# Patient Record
Sex: Female | Born: 1935 | Race: White | Hispanic: No | Marital: Married | Smoking: Never smoker
Health system: Southern US, Community
[De-identification: ages and names within clinical notes are randomized; demographics above are authoritative.]

## PROBLEM LIST (undated history)

## (undated) DIAGNOSIS — G20A1 Parkinson's disease without dyskinesia, without mention of fluctuations: Secondary | ICD-10-CM

## (undated) DIAGNOSIS — G20 Parkinson's disease: Secondary | ICD-10-CM

---

## 2004-06-29 ENCOUNTER — Ambulatory Visit: Payer: Self-pay | Admitting: Unknown Physician Specialty

## 2004-10-18 ENCOUNTER — Ambulatory Visit: Payer: Self-pay | Admitting: Internal Medicine

## 2005-07-01 ENCOUNTER — Ambulatory Visit: Payer: Self-pay | Admitting: Internal Medicine

## 2005-10-05 ENCOUNTER — Ambulatory Visit: Payer: Self-pay | Admitting: Internal Medicine

## 2005-10-19 ENCOUNTER — Ambulatory Visit: Payer: Self-pay | Admitting: Internal Medicine

## 2006-11-16 ENCOUNTER — Ambulatory Visit: Payer: Self-pay | Admitting: Internal Medicine

## 2007-11-19 ENCOUNTER — Ambulatory Visit: Payer: Self-pay | Admitting: Internal Medicine

## 2008-12-08 ENCOUNTER — Ambulatory Visit: Payer: Self-pay | Admitting: Unknown Physician Specialty

## 2009-02-20 ENCOUNTER — Ambulatory Visit: Payer: Self-pay | Admitting: Unknown Physician Specialty

## 2009-03-02 ENCOUNTER — Ambulatory Visit: Payer: Self-pay | Admitting: Internal Medicine

## 2009-05-25 ENCOUNTER — Inpatient Hospital Stay: Payer: Self-pay | Admitting: Vascular Surgery

## 2010-02-09 ENCOUNTER — Ambulatory Visit: Payer: Self-pay | Admitting: Internal Medicine

## 2010-02-16 ENCOUNTER — Ambulatory Visit: Payer: Self-pay | Admitting: Unknown Physician Specialty

## 2010-02-19 ENCOUNTER — Emergency Department: Payer: Self-pay | Admitting: Emergency Medicine

## 2010-02-19 ENCOUNTER — Ambulatory Visit: Payer: Self-pay | Admitting: Internal Medicine

## 2010-03-04 ENCOUNTER — Ambulatory Visit: Payer: Self-pay | Admitting: Internal Medicine

## 2011-04-21 ENCOUNTER — Ambulatory Visit: Payer: Self-pay | Admitting: Internal Medicine

## 2012-02-08 ENCOUNTER — Ambulatory Visit: Payer: Self-pay | Admitting: Emergency Medicine

## 2012-03-12 ENCOUNTER — Ambulatory Visit: Payer: Self-pay | Admitting: Emergency Medicine

## 2012-04-24 ENCOUNTER — Ambulatory Visit: Payer: Self-pay | Admitting: Family Medicine

## 2012-10-24 ENCOUNTER — Ambulatory Visit: Payer: Self-pay | Admitting: Family Medicine

## 2013-06-06 ENCOUNTER — Ambulatory Visit: Payer: Self-pay | Admitting: Family Medicine

## 2016-07-19 ENCOUNTER — Encounter: Payer: Self-pay | Admitting: Emergency Medicine

## 2016-07-19 ENCOUNTER — Inpatient Hospital Stay
Admission: EM | Admit: 2016-07-19 | Discharge: 2016-07-23 | DRG: 481 | Disposition: A | Discharge status: Skilled Nursing Facility | Payer: Medicare Other | Attending: Internal Medicine | Admitting: Internal Medicine

## 2016-07-19 ENCOUNTER — Emergency Department: Payer: Medicare Other

## 2016-07-19 DIAGNOSIS — S72009A Fracture of unspecified part of neck of unspecified femur, initial encounter for closed fracture: Secondary | ICD-10-CM | POA: Diagnosis present

## 2016-07-19 DIAGNOSIS — F0390 Unspecified dementia without behavioral disturbance: Secondary | ICD-10-CM | POA: Diagnosis present

## 2016-07-19 DIAGNOSIS — W1830XA Fall on same level, unspecified, initial encounter: Secondary | ICD-10-CM | POA: Diagnosis present

## 2016-07-19 DIAGNOSIS — R509 Fever, unspecified: Secondary | ICD-10-CM

## 2016-07-19 DIAGNOSIS — Y92009 Unspecified place in unspecified non-institutional (private) residence as the place of occurrence of the external cause: Secondary | ICD-10-CM

## 2016-07-19 DIAGNOSIS — E611 Iron deficiency: Secondary | ICD-10-CM | POA: Diagnosis present

## 2016-07-19 DIAGNOSIS — R262 Difficulty in walking, not elsewhere classified: Secondary | ICD-10-CM

## 2016-07-19 DIAGNOSIS — R5383 Other fatigue: Secondary | ICD-10-CM | POA: Diagnosis present

## 2016-07-19 DIAGNOSIS — M25551 Pain in right hip: Secondary | ICD-10-CM

## 2016-07-19 DIAGNOSIS — Z79899 Other long term (current) drug therapy: Secondary | ICD-10-CM

## 2016-07-19 DIAGNOSIS — R630 Anorexia: Secondary | ICD-10-CM | POA: Diagnosis present

## 2016-07-19 DIAGNOSIS — M6282 Rhabdomyolysis: Secondary | ICD-10-CM | POA: Diagnosis present

## 2016-07-19 DIAGNOSIS — Z419 Encounter for procedure for purposes other than remedying health state, unspecified: Secondary | ICD-10-CM

## 2016-07-19 DIAGNOSIS — S72141A Displaced intertrochanteric fracture of right femur, initial encounter for closed fracture: Secondary | ICD-10-CM | POA: Diagnosis present

## 2016-07-19 DIAGNOSIS — G20 Parkinson's disease: Secondary | ICD-10-CM | POA: Diagnosis present

## 2016-07-19 DIAGNOSIS — M6281 Muscle weakness (generalized): Secondary | ICD-10-CM

## 2016-07-19 DIAGNOSIS — R0602 Shortness of breath: Secondary | ICD-10-CM

## 2016-07-19 HISTORY — DX: Parkinson's disease without dyskinesia, without mention of fluctuations: G20.A1

## 2016-07-19 HISTORY — DX: Parkinson's disease: G20

## 2016-07-19 LAB — COMPREHENSIVE METABOLIC PANEL
ALK PHOS: 51 U/L (ref 38–126)
ALK PHOS: 43 U/L (ref 38–126)
ALT: 16 U/L (ref 14–54)
ALT: 20 U/L (ref 14–54)
ANION GAP: 10 (ref 5–15)
ANION GAP: 9 (ref 5–15)
AST: 73 U/L — ABNORMAL HIGH (ref 15–41)
AST: 71 U/L — ABNORMAL HIGH (ref 15–41)
Albumin: 3.7 g/dL (ref 3.5–5.0)
Albumin: 3.3 g/dL — ABNORMAL LOW (ref 3.5–5.0)
BILIRUBIN TOTAL: 1.3 mg/dL — AB (ref 0.3–1.2)
BILIRUBIN TOTAL: 1.1 mg/dL (ref 0.3–1.2)
BUN: 30 mg/dL — ABNORMAL HIGH (ref 6–20)
BUN: 30 mg/dL — ABNORMAL HIGH (ref 6–20)
CALCIUM: 8.1 mg/dL — AB (ref 8.9–10.3)
CO2: 30 mmol/L (ref 22–32)
CO2: 28 mmol/L (ref 22–32)
CREATININE: 1.01 mg/dL — AB (ref 0.44–1.00)
Calcium: 8.7 mg/dL — ABNORMAL LOW (ref 8.9–10.3)
Chloride: 100 mmol/L — ABNORMAL LOW (ref 101–111)
Chloride: 103 mmol/L (ref 101–111)
Creatinine, Ser: 1.09 mg/dL — ABNORMAL HIGH (ref 0.44–1.00)
GFR calc non Af Amer: 45 mL/min — ABNORMAL LOW (ref >60)
GFR calc non Af Amer: 50 mL/min — ABNORMAL LOW (ref >60)
GFR, EST AFRICAN AMERICAN: 52 mL/min — AB (ref >60)
GFR, EST AFRICAN AMERICAN: 58 mL/min — AB (ref >60)
GLUCOSE: 168 mg/dL — AB (ref 65–99)
GLUCOSE: 133 mg/dL — AB (ref 65–99)
Potassium: 3.8 mmol/L (ref 3.5–5.1)
Potassium: 4.1 mmol/L (ref 3.5–5.1)
Sodium: 140 mmol/L (ref 135–145)
Sodium: 140 mmol/L (ref 135–145)
TOTAL PROTEIN: 7.3 g/dL (ref 6.5–8.1)
TOTAL PROTEIN: 6.5 g/dL (ref 6.5–8.1)

## 2016-07-19 LAB — PROTIME-INR
INR: 1.02
INR: 1.06
Prothrombin Time: 13.4 seconds (ref 11.4–15.2)
Prothrombin Time: 13.8 seconds (ref 11.4–15.2)

## 2016-07-19 LAB — CBC WITH DIFFERENTIAL/PLATELET
Basophils Absolute: 0 10*3/uL (ref 0–0.1)
Basophils Absolute: 0 10*3/uL (ref 0–0.1)
Basophils Relative: 0 %
Basophils Relative: 0 %
Eosinophils Absolute: 0 10*3/uL (ref 0–0.7)
Eosinophils Absolute: 0 10*3/uL (ref 0–0.7)
Eosinophils Relative: 0 %
Eosinophils Relative: 0 %
HEMATOCRIT: 39.6 % (ref 35.0–47.0)
HEMATOCRIT: 37.7 % (ref 35.0–47.0)
HEMOGLOBIN: 13.6 g/dL (ref 12.0–16.0)
Hemoglobin: 13.1 g/dL (ref 12.0–16.0)
LYMPHS ABS: 0.5 10*3/uL — AB (ref 1.0–3.6)
LYMPHS ABS: 0.8 10*3/uL — AB (ref 1.0–3.6)
LYMPHS PCT: 6 %
Lymphocytes Relative: 3 %
MCH: 31.7 pg (ref 26.0–34.0)
MCH: 32 pg (ref 26.0–34.0)
MCHC: 34.3 g/dL (ref 32.0–36.0)
MCHC: 34.8 g/dL (ref 32.0–36.0)
MCV: 92.4 fL (ref 80.0–100.0)
MCV: 92.1 fL (ref 80.0–100.0)
MONOS PCT: 7 %
MONOS PCT: 9 %
Monocytes Absolute: 1 10*3/uL — ABNORMAL HIGH (ref 0.2–0.9)
Monocytes Absolute: 1.3 10*3/uL — ABNORMAL HIGH (ref 0.2–0.9)
NEUTROS ABS: 12.7 10*3/uL — AB (ref 1.4–6.5)
NEUTROS ABS: 12.1 10*3/uL — AB (ref 1.4–6.5)
NEUTROS PCT: 90 %
NEUTROS PCT: 85 %
Platelets: 166 10*3/uL (ref 150–440)
Platelets: 157 10*3/uL (ref 150–440)
RBC: 4.29 MIL/uL (ref 3.80–5.20)
RBC: 4.09 MIL/uL (ref 3.80–5.20)
RDW: 14.2 % (ref 11.5–14.5)
RDW: 14.1 % (ref 11.5–14.5)
WBC: 14.2 10*3/uL — ABNORMAL HIGH (ref 3.6–11.0)
WBC: 14.1 10*3/uL — AB (ref 3.6–11.0)

## 2016-07-19 LAB — URINALYSIS, ROUTINE W REFLEX MICROSCOPIC
Bacteria, UA: NONE SEEN
Bilirubin Urine: NEGATIVE
GLUCOSE, UA: 50 mg/dL — AB
Hgb urine dipstick: NEGATIVE
KETONES UR: 5 mg/dL — AB
Nitrite: NEGATIVE
PH: 5 (ref 5.0–8.0)
Protein, ur: 30 mg/dL — AB
SPECIFIC GRAVITY, URINE: 1.024 (ref 1.005–1.030)

## 2016-07-19 LAB — TYPE AND SCREEN
ABO/RH(D): O POS
ANTIBODY SCREEN: NEGATIVE

## 2016-07-19 LAB — APTT: aPTT: 27 seconds (ref 24–36)

## 2016-07-19 LAB — CK: Total CK: 2307 U/L — ABNORMAL HIGH (ref 38–234)

## 2016-07-19 LAB — TROPONIN I: TROPONIN I: 0.03 ng/mL — AB (ref <0.03)

## 2016-07-19 MED ORDER — SODIUM CHLORIDE 0.9 % IV BOLUS (SEPSIS)
1000.0000 mL | Freq: Once | INTRAVENOUS | Status: AC
  Administered 2016-07-19: 1000 mL via INTRAVENOUS

## 2016-07-19 MED ORDER — SODIUM CHLORIDE 0.9 % IV SOLN
INTRAVENOUS | Status: DC
  Administered 2016-07-19: 19:00:00 via INTRAVENOUS

## 2016-07-19 MED ORDER — CALCIUM CARBONATE-VITAMIN D 600-400 MG-UNIT PO TABS
1.0000 | ORAL_TABLET | Freq: Every day | ORAL | Status: DC
  Filled 2016-07-19: qty 1

## 2016-07-19 MED ORDER — MORPHINE SULFATE (PF) 4 MG/ML IV SOLN
1.0000 mg | INTRAVENOUS | Status: DC | PRN
  Administered 2016-07-19: 1 mg via INTRAVENOUS
  Filled 2016-07-19: qty 1

## 2016-07-19 MED ORDER — MORPHINE SULFATE (PF) 4 MG/ML IV SOLN
4.0000 mg | Freq: Once | INTRAVENOUS | Status: AC
  Administered 2016-07-19: 4 mg via INTRAVENOUS
  Filled 2016-07-19: qty 1

## 2016-07-19 MED ORDER — ALOE VERA 500 MG PO CAPS: 1.0000 | ORAL_CAPSULE | Freq: Every evening | ORAL | Status: DC

## 2016-07-19 MED ORDER — ZOLPIDEM TARTRATE 5 MG PO TABS: 5.0000 mg | ORAL_TABLET | Freq: Every evening | ORAL | Status: DC | PRN

## 2016-07-19 MED ORDER — CLINDAMYCIN PHOSPHATE 600 MG/50ML IV SOLN
600.0000 mg | INTRAVENOUS | Status: AC
  Administered 2016-07-20: 600 mg via INTRAVENOUS
  Filled 2016-07-19: qty 50

## 2016-07-19 MED ORDER — CITALOPRAM HYDROBROMIDE 20 MG PO TABS
20.0000 mg | ORAL_TABLET | Freq: Every day | ORAL | Status: DC
  Administered 2016-07-19: 20 mg via ORAL
  Filled 2016-07-19: qty 1

## 2016-07-19 MED ORDER — RALOXIFENE HCL 60 MG PO TABS
60.0000 mg | ORAL_TABLET | Freq: Every day | ORAL | Status: DC
  Administered 2016-07-19: 60 mg via ORAL
  Filled 2016-07-19: qty 1

## 2016-07-19 MED ORDER — CARBIDOPA-LEVODOPA ER 50-200 MG PO TBCR
1.0000 | EXTENDED_RELEASE_TABLET | Freq: Two times a day (BID) | ORAL | Status: DC
  Administered 2016-07-19: 1 via ORAL
  Filled 2016-07-19 (×2): qty 1

## 2016-07-19 MED ORDER — METHOCARBAMOL 1000 MG/10ML IJ SOLN
500.0000 mg | Freq: Four times a day (QID) | INTRAVENOUS | Status: DC | PRN
  Filled 2016-07-19: qty 5

## 2016-07-19 MED ORDER — METHOCARBAMOL 500 MG PO TABS: 500.0000 mg | ORAL_TABLET | Freq: Four times a day (QID) | ORAL | Status: DC | PRN

## 2016-07-19 MED ORDER — CEFAZOLIN SODIUM-DEXTROSE 2-4 GM/100ML-% IV SOLN
2.0000 g | INTRAVENOUS | Status: AC
  Administered 2016-07-20: 2 g via INTRAVENOUS
  Filled 2016-07-19: qty 100

## 2016-07-19 MED ORDER — SENNA 8.6 MG PO TABS
1.0000 | ORAL_TABLET | Freq: Two times a day (BID) | ORAL | Status: DC
  Administered 2016-07-19 (×2): 8.6 mg via ORAL
  Filled 2016-07-19 (×2): qty 1

## 2016-07-19 MED ORDER — HYDROCODONE-ACETAMINOPHEN 5-325 MG PO TABS
1.0000 | ORAL_TABLET | Freq: Four times a day (QID) | ORAL | Status: DC | PRN
  Administered 2016-07-19 (×2): 1 via ORAL
  Filled 2016-07-19 (×2): qty 1

## 2016-07-19 NOTE — ED Triage Notes (Signed)
Pt via ems from home; last seen last night at 1730; found on floor today. Pt has shortening and external rotation of right lower extremity. Pt A&O x 4. NAD noted.

## 2016-07-19 NOTE — ED Provider Notes (Signed)
[facility]-EMERGENCY DEPARTMENT Provider Note   CSN: [id number] Arrival date & time: 07/19/16  1058     History   Chief Complaint Chief Complaint  Patient presents with  . Leg Injury  . Fall    HPI Wendy Meyer is a 82 y.o. female hx of parkinson's, thyroid nodule, Here presenting with fall. Patient states that she slip and fall and leg on the right hip. Patient was unable to get up afterwards. Patient states that she did hit her head as well. Patient was found by family laying on the floor this morning. Last time somebody check on her was yesterday around 5:30 PM. Patient not on blood thinners. Was noted to have obvious deformity R hip as per EMS.   The history is provided by the patient.    History reviewed. No pertinent past medical history.  There are no active problems to display for this patient.   History reviewed. No pertinent surgical history.  OB History    No data available       Home Medications    Prior to Admission medications   Not on File    Family History History reviewed. No pertinent family history.  Social History Social History  Substance Use Topics  . Smoking status: Unknown If Ever Smoked  . Smokeless tobacco: Never Used  . Alcohol use No     Allergies   Patient has no allergy information on record.   Review of Systems Review of Systems  Musculoskeletal:       R hip pain   All other systems reviewed and are negative.    Physical Exam Updated Vital Signs BP (!) 104/53 (BP Location: Right Arm)   Pulse 100   Temp 98.5 F (36.9 C) (Oral)   Resp 20   Ht 5\' 7"  (1.702 m)   Wt 148 lb 13 oz (67.5 kg)   SpO2 97%   BMI 23.31 kg/m   Physical Exam  Constitutional:  Uncomfortable, chronically ill   HENT:  Head: Normocephalic.  No obvious scalp hematoma   Eyes: EOM are normal. Pupils are equal, round, and reactive to light.  Neck: Normal range of motion. Neck supple.  No obvious deformity. C collar applied     Cardiovascular: Normal rate, regular rhythm and normal heart sounds.   Pulmonary/Chest: Effort normal and breath sounds normal. No respiratory distress. She has no wheezes.  Abdominal: Soft. Bowel sounds are normal. She exhibits no distension. There is no tenderness. There is no guarding.  Musculoskeletal:  Obvious deformity R hip, able to wiggle toes, 2+ dorsalis pedis pulse. No obvious spinal tenderness   Neurological: She is alert.  Skin: Skin is warm.  Psychiatric: She has a normal mood and affect.  Nursing note and vitals reviewed.    ED Treatments / Results  Labs (all labs ordered are listed, but only abnormal results are displayed) Labs Reviewed  CBC WITH DIFFERENTIAL/PLATELET - Abnormal; Notable for the following:       Result Value   WBC 14.2 (*)    Neutro Abs 12.7 (*)    Lymphs Abs 0.5 (*)    Monocytes Absolute 1.0 (*)    All other components within normal limits  COMPREHENSIVE METABOLIC PANEL - Abnormal; Notable for the following:    Chloride 100 (*)    Glucose, Bld 168 (*)    BUN 30 (*)    Creatinine, Ser 1.09 (*)    Calcium 8.7 (*)    AST 73 (*)    Total  Bilirubin 1.3 (*)    GFR calc non Af Amer 45 (*)    GFR calc Af Amer 52 (*)    All other components within normal limits  TROPONIN I - Abnormal; Notable for the following:    Troponin I 0.03 (*)    All other components within normal limits  CK - Abnormal; Notable for the following:    Total CK 2,307 (*)    All other components within normal limits  PROTIME-INR    EKG  EKG Interpretation None      ED ECG REPORT I, Richardean Canal, the attending physician, personally viewed and interpreted this ECG.   Date: 07/19/2016  EKG Time: 11:20 am  Rate: 97  Rhythm: normal EKG, normal sinus rhythm  Axis: normal  Intervals:none  ST&T Change: nonspecific    Radiology Dg Chest 1 View  Result Date: 07/19/2016 CLINICAL DATA:  Fall. EXAM: CHEST 1 VIEW COMPARISON:  05/25/2009 . FINDINGS: Mediastinum and hilar  structures are normal. Basilar pulmonary interstitial prominence noted consistent with chronic interstitial lung disease. No change from prior exam . Heart size stable. No pulmonary venous congestion . IMPRESSION: Chronic interstitial lung disease. Electronically Signed   By: Maisie Fus  Register   On: 07/19/2016 12:07   Ct Head Wo Contrast  Result Date: 07/19/2016 CLINICAL DATA:  Confusion, weakness, found on the floor, neck pain EXAM: CT HEAD WITHOUT CONTRAST CT CERVICAL SPINE WITHOUT CONTRAST TECHNIQUE: Multidetector CT imaging of the head and cervical spine was performed following the standard protocol without intravenous contrast. Multiplanar CT image reconstructions of the cervical spine were also generated. COMPARISON:  10/30/ 10 FINDINGS: CT HEAD FINDINGS Brain: No intracranial hemorrhage, mass effect or midline shift. Moderate cerebral atrophy. Extensive periventricular and patchy subcortical white matter decreased attenuation probable due to small vessel ischemic changes. No acute cortical infarction. No mass lesion is noted on this unenhanced scan. Vascular: Atherosclerotic calcifications of carotid siphon. Atherosclerotic calcifications of vertebral arteries. Skull: No skull fracture is noted. Metallic dental artifact are noted. Sinuses/Orbits: No acute findings. There is previous right mastoidectomy Other: None CT CERVICAL SPINE FINDINGS Alignment: Normal alignment of the cervical spine Skull base and vertebrae: No acute fracture or subluxation. Degenerative changes are noted C1-C2 articulation. Mild anterior spurring at C5-C6 level. Soft tissues and spinal canal: Spinal canal is patent. No spinal canal hematoma. No prevertebral soft tissue swelling. Disc levels:  Minimal disc space flattening at C5-C6 level. Upper chest: There is no pneumothorax in visualized lung apices. Other: Atherosclerotic calcifications are noted bilateral carotid bifurcation. IMPRESSION: 1. No acute intracranial abnormality.  Moderate cerebral atrophy. No definite acute cortical infarction. Significant periventricular and patchy subcortical white matter decreased attenuation probable due to chronic small vessel ischemic changes. 2. No cervical spine acute fracture or subluxation. Degenerative changes as described above. Electronically Signed   By: Natasha Mead M.D.   On: 07/19/2016 12:02   Ct Cervical Spine Wo Contrast  Result Date: 07/19/2016 CLINICAL DATA:  Confusion, weakness, found on the floor, neck pain EXAM: CT HEAD WITHOUT CONTRAST CT CERVICAL SPINE WITHOUT CONTRAST TECHNIQUE: Multidetector CT imaging of the head and cervical spine was performed following the standard protocol without intravenous contrast. Multiplanar CT image reconstructions of the cervical spine were also generated. COMPARISON:  10/30/ 10 FINDINGS: CT HEAD FINDINGS Brain: No intracranial hemorrhage, mass effect or midline shift. Moderate cerebral atrophy. Extensive periventricular and patchy subcortical white matter decreased attenuation probable due to small vessel ischemic changes. No acute cortical infarction. No mass lesion is  noted on this unenhanced scan. Vascular: Atherosclerotic calcifications of carotid siphon. Atherosclerotic calcifications of vertebral arteries. Skull: No skull fracture is noted. Metallic dental artifact are noted. Sinuses/Orbits: No acute findings. There is previous right mastoidectomy Other: None CT CERVICAL SPINE FINDINGS Alignment: Normal alignment of the cervical spine Skull base and vertebrae: No acute fracture or subluxation. Degenerative changes are noted C1-C2 articulation. Mild anterior spurring at C5-C6 level. Soft tissues and spinal canal: Spinal canal is patent. No spinal canal hematoma. No prevertebral soft tissue swelling. Disc levels:  Minimal disc space flattening at C5-C6 level. Upper chest: There is no pneumothorax in visualized lung apices. Other: Atherosclerotic calcifications are noted bilateral carotid  bifurcation. IMPRESSION: 1. No acute intracranial abnormality. Moderate cerebral atrophy. No definite acute cortical infarction. Significant periventricular and patchy subcortical white matter decreased attenuation probable due to chronic small vessel ischemic changes. 2. No cervical spine acute fracture or subluxation. Degenerative changes as described above. Electronically Signed   By: Natasha MeadLiviu  Pop M.D.   On: 07/19/2016 12:02   Dg Hip Unilat W Or Wo Pelvis 2-3 Views Right  Result Date: 07/19/2016 CLINICAL DATA:  Injury. EXAM: DG HIP (WITH OR WITHOUT PELVIS) 2-3V RIGHT COMPARISON:  No recent prior. FINDINGS: Angulated comminuted right intertrochanteric hip fracture noted. Diffuse osteopenia degenerative change. Aortoiliac atherosclerotic vascular calcification. Prominent amount of stool noted throughout the colon. IMPRESSION: Angulated comminuted intertrochanteric hip fracture on the right noted. Electronically Signed   By: Maisie Fushomas  Register   On: 07/19/2016 12:09    Procedures Procedures (including critical care time)  Medications Ordered in ED Medications  morphine 4 MG/ML injection 4 mg (4 mg Intravenous Given 07/19/16 1212)     Initial Impression / Assessment and Plan / ED Course  I have reviewed the triage vital signs and the nursing notes.  Pertinent labs & imaging results that were available during my care of the patient were reviewed by me and considered in my medical decision making (see chart for details).  Clinical Course     Wendy Meyer is a 82 y.o. female here with R hip pain s/p fall. Had mechanical fall with head injury with obvious R hip deformity. Will get preop labs, EKG, CT head/neck, xrays.  12:35 PM Xray showed R inter trochanteric fracture. CK 2000, no acute renal failure. CT head/neck unremarkable. Dr. Hyacinth MeekerMiller to perform surgery tomorrow. Hospitalist to admit for preop clearance, mild rhabdo.     Final Clinical Impressions(s) / ED Diagnoses   Final  diagnoses:  None    New Prescriptions New Prescriptions   No medications on file     Charlynne Panderavid Hsienta Yao, MD 07/19/16 1236

## 2016-07-19 NOTE — Progress Notes (Signed)
Family Meeting Note  Advance Directive:yes  Today a meeting took place with the grandson and his wife in the emergency room Patient is is unable to participate due ZO:[id number]:Lacked capacity to make decision   The following clinical team members were present during this meeting: Patient's grandson and his wife The following were discussed:Patient's diagnosis: Discussed with family regarding patient's acute hip fracture and questions answered  CODE STATUS addressed. Patient is a full code. Healthcare power of attorney is patient's grandson Wendy Meyer  Time spent during discussion20 mins Enedina FinnerPATEL,[name], MD

## 2016-07-19 NOTE — ED Notes (Signed)
Judeth CornfieldStephanie, granddaughter, [phone]

## 2016-07-19 NOTE — ED Notes (Signed)
Pt's POA and living will scanned by registration Byrd Hesselbach(Maria) and originals returned to grandson at bedside.

## 2016-07-19 NOTE — H&P (Signed)
[facility] [place] at [facility]   PATIENT NAME: Wendy Meyer    MR#:  [id number]  DATE OF BIRTH:  February 01, 1930  DATE OF ADMISSION:  07/19/2016  PRIMARY CARE PHYSICIAN: Rolm Gala, MD   REQUESTING/REFERRING PHYSICIAN:  Dr Darnelle Catalan  Mechanical fall at home. HISTORY OF PRESENT ILLNESS:  Wendy Meyer  is a 82 y.o. female with a known history of Parkinson's disease comes to the emergency room after she had a mechanical fall at home. History is obtained from patient's grandson and his wife are her healthcare power of attorney. Patient was seen by his grandson last night at 5:30 PM. This morning when his wife went to go check on the patient up putting the routine she was found laying on the floor. Her night light was on and it seems patient was down since middle of the night. Duration unknown. Her evaluation in the emergency room found she is right intertrochanteric fracture along with elevated CPK suggesting acute rhabdomyolysis. She is being admitted for further evaluation and management. Per family she has been having issues with memory and mixes her heart disease at night. She also has underlying mild dementia. Patient is being admitted for further eval showed management. Per family she does not have any cardiac history.  PAST MEDICAL HISTORY:   Past Medical History:  Diagnosis Date  . Parkinson disease (HCC)     PAST SURGICAL HISTOIRY:  History reviewed. No pertinent surgical history.  SOCIAL HISTORY:   Social History  Substance Use Topics  . Smoking status: Unknown If Ever Smoked  . Smokeless tobacco: Never Used  . Alcohol use No    FAMILY HISTORY:  History reviewed. No pertinent family history.  DRUG ALLERGIES:   Allergies  Allergen Reactions  . Nsaids Other (See Comments)    Gastric upset.     REVIEW OF SYSTEMS:  Review of Systems  Constitutional: Negative for chills, fever and weight loss.  HENT: Negative for ear  discharge, ear pain and nosebleeds.   Eyes: Negative for blurred vision, pain and discharge.  Respiratory: Negative for sputum production, shortness of breath, wheezing and stridor.   Cardiovascular: Negative for chest pain, palpitations, orthopnea and PND.  Gastrointestinal: Negative for abdominal pain, diarrhea, nausea and vomiting.  Genitourinary: Negative for frequency and urgency.  Musculoskeletal: Positive for joint pain. Negative for back pain.  Neurological: Positive for weakness. Negative for sensory change, speech change and focal weakness.  Psychiatric/Behavioral: Negative for depression and hallucinations. The patient is not nervous/anxious.      MEDICATIONS AT HOME:   Prior to Admission medications   Medication Sig Start Date End Date Taking? Authorizing Provider  Aloe Vera 500 MG CAPS Take 1 capsule by mouth every evening.   Yes Historical Provider, MD  Calcium Carbonate-Vitamin D (CALTRATE 600+D) 600-400 MG-UNIT tablet Take 1 tablet by mouth daily.   Yes Historical Provider, MD  carbidopa-levodopa (SINEMET CR) 50-200 MG tablet Take 1 tablet by mouth 2 (two) times daily.   Yes Historical Provider, MD  citalopram (CELEXA) 20 MG tablet Take 20 mg by mouth daily.   Yes Historical Provider, MD  raloxifene (EVISTA) 60 MG tablet Take 60 mg by mouth daily.   Yes Historical Provider, MD      VITAL SIGNS:  Blood pressure (!) 104/53, pulse 100, temperature 98.5 F (36.9 C), temperature source Oral, resp. rate 20, height 5\' 7"  (1.702 m), weight 67.5 kg (148 lb 13 oz), SpO2 97 %.  PHYSICAL EXAMINATION:  GENERAL:  82  y.o.-year-old patient lying in the bed with no acute distress.  EYES: Pupils equal, round, reactive to light and accommodation. No scleral icterus. Extraocular muscles intact.  HEENT: Head atraumatic, normocephalic. Oropharynx and nasopharynx clear. Dry oral mucosa NECK:  Supple, no jugular venous distention. No thyroid enlargement, no tenderness.  LUNGS: Normal breath  sounds bilaterally, no wheezing, rales,rhonchi or crepitation. No use of accessory muscles of respiration.  CARDIOVASCULAR: S1, S2 normal. No murmurs, rubs, or gallops.  ABDOMEN: Soft, nontender, nondistended. Bowel sounds present. No organomegaly or mass.  EXTREMITIES: Right lower extremity rotated. No pedal edema, cyanosis, or clubbing.  NEUROLOGIC: Overall nonfocal unable to assess at present details secondary to patient's mild dementia. She also has fracture of the hip and unable to move her legs.  PSYCHIATRIC: The patient is alert SKIN: No obvious rash, lesion, or ulcer.   LABORATORY PANEL:   CBC  Recent Labs Lab 07/19/16 1118  WBC 14.2*  HGB 13.6  HCT 39.6  PLT 166   ------------------------------------------------------------------------------------------------------------------  Chemistries   Recent Labs Lab 07/19/16 1118  NA 140  K 3.8  CL 100*  CO2 30  GLUCOSE 168*  BUN 30*  CREATININE 1.09*  CALCIUM 8.7*  AST 73*  ALT 16  ALKPHOS 51  BILITOT 1.3*   ------------------------------------------------------------------------------------------------------------------  Cardiac Enzymes  Recent Labs Lab 07/19/16 1118  TROPONINI 0.03*   ------------------------------------------------------------------------------------------------------------------  RADIOLOGY:  Dg Chest 1 View  Result Date: 07/19/2016 CLINICAL DATA:  Fall. EXAM: CHEST 1 VIEW COMPARISON:  05/25/2009 . FINDINGS: Mediastinum and hilar structures are normal. Basilar pulmonary interstitial prominence noted consistent with chronic interstitial lung disease. No change from prior exam . Heart size stable. No pulmonary venous congestion . IMPRESSION: Chronic interstitial lung disease. Electronically Signed   By: Maisie Fus  Register   On: 07/19/2016 12:07   Ct Head Wo Contrast  Result Date: 07/19/2016 CLINICAL DATA:  Confusion, weakness, found on the floor, neck pain EXAM: CT HEAD WITHOUT CONTRAST CT  CERVICAL SPINE WITHOUT CONTRAST TECHNIQUE: Multidetector CT imaging of the head and cervical spine was performed following the standard protocol without intravenous contrast. Multiplanar CT image reconstructions of the cervical spine were also generated. COMPARISON:  10/30/ 10 FINDINGS: CT HEAD FINDINGS Brain: No intracranial hemorrhage, mass effect or midline shift. Moderate cerebral atrophy. Extensive periventricular and patchy subcortical white matter decreased attenuation probable due to small vessel ischemic changes. No acute cortical infarction. No mass lesion is noted on this unenhanced scan. Vascular: Atherosclerotic calcifications of carotid siphon. Atherosclerotic calcifications of vertebral arteries. Skull: No skull fracture is noted. Metallic dental artifact are noted. Sinuses/Orbits: No acute findings. There is previous right mastoidectomy Other: None CT CERVICAL SPINE FINDINGS Alignment: Normal alignment of the cervical spine Skull base and vertebrae: No acute fracture or subluxation. Degenerative changes are noted C1-C2 articulation. Mild anterior spurring at C5-C6 level. Soft tissues and spinal canal: Spinal canal is patent. No spinal canal hematoma. No prevertebral soft tissue swelling. Disc levels:  Minimal disc space flattening at C5-C6 level. Upper chest: There is no pneumothorax in visualized lung apices. Other: Atherosclerotic calcifications are noted bilateral carotid bifurcation. IMPRESSION: 1. No acute intracranial abnormality. Moderate cerebral atrophy. No definite acute cortical infarction. Significant periventricular and patchy subcortical white matter decreased attenuation probable due to chronic small vessel ischemic changes. 2. No cervical spine acute fracture or subluxation. Degenerative changes as described above. Electronically Signed   By: Natasha Mead M.D.   On: 07/19/2016 12:02   Ct Cervical Spine Wo Contrast  Result Date: 07/19/2016 CLINICAL  DATA:  Confusion, weakness, found  on the floor, neck pain EXAM: CT HEAD WITHOUT CONTRAST CT CERVICAL SPINE WITHOUT CONTRAST TECHNIQUE: Multidetector CT imaging of the head and cervical spine was performed following the standard protocol without intravenous contrast. Multiplanar CT image reconstructions of the cervical spine were also generated. COMPARISON:  10/30/ 10 FINDINGS: CT HEAD FINDINGS Brain: No intracranial hemorrhage, mass effect or midline shift. Moderate cerebral atrophy. Extensive periventricular and patchy subcortical white matter decreased attenuation probable due to small vessel ischemic changes. No acute cortical infarction. No mass lesion is noted on this unenhanced scan. Vascular: Atherosclerotic calcifications of carotid siphon. Atherosclerotic calcifications of vertebral arteries. Skull: No skull fracture is noted. Metallic dental artifact are noted. Sinuses/Orbits: No acute findings. There is previous right mastoidectomy Other: None CT CERVICAL SPINE FINDINGS Alignment: Normal alignment of the cervical spine Skull base and vertebrae: No acute fracture or subluxation. Degenerative changes are noted C1-C2 articulation. Mild anterior spurring at C5-C6 level. Soft tissues and spinal canal: Spinal canal is patent. No spinal canal hematoma. No prevertebral soft tissue swelling. Disc levels:  Minimal disc space flattening at C5-C6 level. Upper chest: There is no pneumothorax in visualized lung apices. Other: Atherosclerotic calcifications are noted bilateral carotid bifurcation. IMPRESSION: 1. No acute intracranial abnormality. Moderate cerebral atrophy. No definite acute cortical infarction. Significant periventricular and patchy subcortical white matter decreased attenuation probable due to chronic small vessel ischemic changes. 2. No cervical spine acute fracture or subluxation. Degenerative changes as described above. Electronically Signed   By: Natasha MeadLiviu  Pop M.D.   On: 07/19/2016 12:02   Dg Hip Unilat W Or Wo Pelvis 2-3 Views  Right  Result Date: 07/19/2016 CLINICAL DATA:  Injury. EXAM: DG HIP (WITH OR WITHOUT PELVIS) 2-3V RIGHT COMPARISON:  No recent prior. FINDINGS: Angulated comminuted right intertrochanteric hip fracture noted. Diffuse osteopenia degenerative change. Aortoiliac atherosclerotic vascular calcification. Prominent amount of stool noted throughout the colon. IMPRESSION: Angulated comminuted intertrochanteric hip fracture on the right noted. Electronically Signed   By: Maisie Fushomas  Register   On: 07/19/2016 12:09    EKG:    IMPRESSION AND PLAN:   Ulyses JarredGeraldine Clerk  is a 82 y.o. female with a known history of Parkinson's disease comes to the emergency room after she had a mechanical fall at home. History is obtained from patient's grandson and his wife are her healthcare power of attorney. Patient was seen by his grandson last night at 5:30 PM. This morning when his wife went to go check on the patient up putting the routine she was found laying on the floor. Her night light was on and it seems patient was down since middle of the night. Duration unknown. Her evaluation in the emergency room found she is right intertrochanteric fracture along with elevated CPK suggesting acute rhabdomyolysis.  1. Acute right intertrochanteric fracture secondary to mechanical fall -Admitted to orthopedic -IV fluids -Orthopedic consultation with Dr. Hyacinth MeekerMiller -IV when necessary pain meds, nothing by mouth after midnight -Patient is low to intermediate risk for surgery. She has no cardiac history. EKG shows normal sinus rhythm nonspecific T-wave changes. -Patient denies chest pain.  2. Parkinson's disease -Continue home medications Sinemet  3. Leukocytosis appears reactive  4. Acute rhabdomyolysis secondary to being down on the floor for several hours continue IV fluids and monitor CPK -Minute I's and O's  5. DVT prophylaxis SCD and teds -DVT prophylaxis with Lovenox after surgery  All the records are reviewed and case  discussed with ED provider. Management plans discussed with the  patient, family and they are in agreement.  CODE STATUS: FULL  Discussed at length with patient's grandson and daughter-in-law  TOTAL TIME TAKING CARE OF THIS PATIENT: 50 minutes.    Qasim Diveley M.D on 07/19/2016 at 2:32 PM  Between 7am to 6pm - Pager - [phone]  After 6pm go to [email or web address] - password [email or web address] [facility]  [facility]  Office  [phone]  CC: Primary care physician; Rolm GalaGRANDIS, HEIDI, MD

## 2016-07-19 NOTE — ED Notes (Signed)
Admitting MD at bedside.

## 2016-07-19 NOTE — [place] FL2 (Signed)
[place] MEDICAID FL2 LEVEL OF CARE SCREENING TOOL     IDENTIFICATION  Patient Name: Wendy Meyer Birthdate: September 05, 1929 Sex: female Admission Date (Current Location): 07/19/2016  [place]   Facility and Address:  [facility], [address], [place], [place] [address]      Provider Number: [id number]  Attending Physician Name and Address:  Enedina FinnerSona [name], MD  Relative Name and Phone Number:       Current Level of Care: Hospital Recommended Level of Care: Skilled Nursing Facility Prior Approval Number:    Date Approved/Denied:   PASRR Number:  [id number])  Discharge Plan: SNF    Current Diagnoses: Patient Active Problem List   Diagnosis Date Noted  . Hip fracture (HCC) 07/19/2016    Orientation RESPIRATION BLADDER Height & Weight     Self, Time, Place, Situation  Normal Continent Weight: 148 lb 13 oz (67.5 kg) Height:  5\' 7"  (170.2 cm)  BEHAVIORAL SYMPTOMS/MOOD NEUROLOGICAL BOWEL NUTRITION STATUS   (none)  (none) Continent Diet (NPO for surgery (please see D/C Summary for updated diet) )  AMBULATORY STATUS COMMUNICATION OF NEEDS Skin   Extensive Assist Verbally Surgical wounds                       Personal Care Assistance Level of Assistance  Bathing, Feeding, Dressing Bathing Assistance: Limited assistance Feeding assistance: Independent Dressing Assistance: Limited assistance     Functional Limitations Info  Sight, Hearing, Speech Sight Info: Adequate Hearing Info: Adequate Speech Info: Adequate    SPECIAL CARE FACTORS FREQUENCY  PT (By licensed PT), OT (By licensed OT)     PT Frequency:  (5) OT Frequency:  (5)            Contractures      Additional Factors Info  Code Status, Allergies Code Status Info:  (Full Code. ) Allergies Info:  (Nsaids)           Current Medications (07/19/2016):  This is the current hospital active medication list Current  Facility-Administered Medications  Medication Dose Route Frequency Provider Last Rate Last Dose  . [START ON 07/20/2016] Calcium Carbonate-Vitamin D 600-400 MG-UNIT 1 tablet  1 tablet Oral Daily Enedina FinnerSona [name], MD      . carbidopa-levodopa (SINEMET CR) 50-200 MG per tablet controlled release 1 tablet  1 tablet Oral BID Enedina FinnerSona [name], MD      . Melene Muller[START ON 07/20/2016] ceFAZolin (ANCEF) IVPB 2g/100 mL premix  2 g Intravenous On Call to OR Deeann SaintHoward Miller, MD      . citalopram (CELEXA) tablet 20 mg  20 mg Oral Daily Enedina FinnerSona [name], MD   20 mg at 07/19/16 1524  . [START ON 07/20/2016] clindamycin (CLEOCIN) IVPB 600 mg  600 mg Intravenous On Call to OR Deeann SaintHoward Miller, MD      . HYDROcodone-acetaminophen (NORCO/VICODIN) 5-325 MG per tablet 1-2 tablet  1-2 tablet Oral Q6H PRN Deeann SaintHoward Miller, MD      . methocarbamol (ROBAXIN) tablet 500 mg  500 mg Oral Q6H PRN Deeann SaintHoward Miller, MD       Or  . methocarbamol (ROBAXIN) 500 mg in dextrose 5 % 50 mL IVPB  500 mg Intravenous Q6H PRN Deeann SaintHoward Miller, MD      . morphine 4 MG/ML injection 1 mg  1 mg Intravenous Q2H PRN Deeann SaintHoward Miller, MD   1 mg at 07/19/16 1552  . raloxifene (EVISTA) tablet 60 mg  60 mg Oral Daily Enedina FinnerSona [name], MD  60 mg at 07/19/16 1524  . senna (SENOKOT) tablet 8.6 mg  1 tablet Oral BID Deeann SaintHoward Miller, MD   8.6 mg at 07/19/16 1524  . zolpidem (AMBIEN) tablet 5 mg  5 mg Oral QHS PRN Deeann SaintHoward Miller, MD         Discharge Medications: Please see discharge summary for a list of discharge medications.  Relevant Imaging Results:  Relevant Lab Results:   Additional Information  (SSN: [id number])  Ritika Hellickson, [name], LCSW

## 2016-07-19 NOTE — Consult Note (Signed)
ORTHOPAEDIC CONSULTATION  REQUESTING PHYSICIAN: Enedina FinnerSona [name], MD  Chief Complaint: Right hip pain  HPI: Wendy Meyer is a 82 y.o. female who complains of  right hip pain following a fall during the night last night at home.  She was last seen in good health.  About 5:30 PM by her grandson last night.  She was found on the floor this morning with a broken right hip following a fall at uncertain time.  She was brought to the emergency room where exam and x-rays reveal a comminuted intertrochanteric fracture of the right hip.  She had mild rhabdomyolysis as well.  He has a history of Parkinson's disease but no significant cardiac disease.  She is not on any blood thinners.  She has been admitted for medical evaluation and surgical evaluation.  I discussed the surgical procedure with the patient's grandson and his wife were better her medical power of attorney.  The risks and benefits and postop protocol was with surgery were discussed and they are in agreement to proceed with surgery tomorrow after we hydrate her.  Patient does have some dementia as well  Past Medical History:  Diagnosis Date  . Parkinson disease (HCC)    History reviewed. No pertinent surgical history. Social History   Social History  . Marital status: Married    Spouse name: N/A  . Number of children: N/A  . Years of education: N/A   Social History Main Topics  . Smoking status: Unknown If Ever Smoked  . Smokeless tobacco: Never Used  . Alcohol use No  . Drug use: No  . Sexual activity: No   Other Topics Concern  . None   Social History Narrative  . None   History reviewed. No pertinent family history. Allergies  Allergen Reactions  . Nsaids Other (See Comments)    Gastric upset.    Prior to Admission medications   Medication Sig Start Date End Date Taking? Authorizing Provider  Aloe Vera 500 MG CAPS Take 1 capsule by mouth every evening.   Yes Historical Provider, MD  Calcium Carbonate-Vitamin D  (CALTRATE 600+D) 600-400 MG-UNIT tablet Take 1 tablet by mouth daily.   Yes Historical Provider, MD  carbidopa-levodopa (SINEMET CR) 50-200 MG tablet Take 1 tablet by mouth 2 (two) times daily.   Yes Historical Provider, MD  citalopram (CELEXA) 20 MG tablet Take 20 mg by mouth daily.   Yes Historical Provider, MD  raloxifene (EVISTA) 60 MG tablet Take 60 mg by mouth daily.   Yes Historical Provider, MD   Dg Chest 1 View  Result Date: 07/19/2016 CLINICAL DATA:  Fall. EXAM: CHEST 1 VIEW COMPARISON:  05/25/2009 . FINDINGS: Mediastinum and hilar structures are normal. Basilar pulmonary interstitial prominence noted consistent with chronic interstitial lung disease. No change from prior exam . Heart size stable. No pulmonary venous congestion . IMPRESSION: Chronic interstitial lung disease. Electronically Signed   By: Maisie Fushomas  Register   On: 07/19/2016 12:07   Ct Head Wo Contrast  Result Date: 07/19/2016 CLINICAL DATA:  Confusion, weakness, found on the floor, neck pain EXAM: CT HEAD WITHOUT CONTRAST CT CERVICAL SPINE WITHOUT CONTRAST TECHNIQUE: Multidetector CT imaging of the head and cervical spine was performed following the standard protocol without intravenous contrast. Multiplanar CT image reconstructions of the cervical spine were also generated. COMPARISON:  10/30/ 10 FINDINGS: CT HEAD FINDINGS Brain: No intracranial hemorrhage, mass effect or midline shift. Moderate cerebral atrophy. Extensive periventricular and patchy subcortical white matter decreased attenuation probable due to small  vessel ischemic changes. No acute cortical infarction. No mass lesion is noted on this unenhanced scan. Vascular: Atherosclerotic calcifications of carotid siphon. Atherosclerotic calcifications of vertebral arteries. Skull: No skull fracture is noted. Metallic dental artifact are noted. Sinuses/Orbits: No acute findings. There is previous right mastoidectomy Other: None CT CERVICAL SPINE FINDINGS Alignment: Normal  alignment of the cervical spine Skull base and vertebrae: No acute fracture or subluxation. Degenerative changes are noted C1-C2 articulation. Mild anterior spurring at C5-C6 level. Soft tissues and spinal canal: Spinal canal is patent. No spinal canal hematoma. No prevertebral soft tissue swelling. Disc levels:  Minimal disc space flattening at C5-C6 level. Upper chest: There is no pneumothorax in visualized lung apices. Other: Atherosclerotic calcifications are noted bilateral carotid bifurcation. IMPRESSION: 1. No acute intracranial abnormality. Moderate cerebral atrophy. No definite acute cortical infarction. Significant periventricular and patchy subcortical white matter decreased attenuation probable due to chronic small vessel ischemic changes. 2. No cervical spine acute fracture or subluxation. Degenerative changes as described above. Electronically Signed   By: Natasha MeadLiviu  Pop M.D.   On: 07/19/2016 12:02   Ct Cervical Spine Wo Contrast  Result Date: 07/19/2016 CLINICAL DATA:  Confusion, weakness, found on the floor, neck pain EXAM: CT HEAD WITHOUT CONTRAST CT CERVICAL SPINE WITHOUT CONTRAST TECHNIQUE: Multidetector CT imaging of the head and cervical spine was performed following the standard protocol without intravenous contrast. Multiplanar CT image reconstructions of the cervical spine were also generated. COMPARISON:  10/30/ 10 FINDINGS: CT HEAD FINDINGS Brain: No intracranial hemorrhage, mass effect or midline shift. Moderate cerebral atrophy. Extensive periventricular and patchy subcortical white matter decreased attenuation probable due to small vessel ischemic changes. No acute cortical infarction. No mass lesion is noted on this unenhanced scan. Vascular: Atherosclerotic calcifications of carotid siphon. Atherosclerotic calcifications of vertebral arteries. Skull: No skull fracture is noted. Metallic dental artifact are noted. Sinuses/Orbits: No acute findings. There is previous right mastoidectomy  Other: None CT CERVICAL SPINE FINDINGS Alignment: Normal alignment of the cervical spine Skull base and vertebrae: No acute fracture or subluxation. Degenerative changes are noted C1-C2 articulation. Mild anterior spurring at C5-C6 level. Soft tissues and spinal canal: Spinal canal is patent. No spinal canal hematoma. No prevertebral soft tissue swelling. Disc levels:  Minimal disc space flattening at C5-C6 level. Upper chest: There is no pneumothorax in visualized lung apices. Other: Atherosclerotic calcifications are noted bilateral carotid bifurcation. IMPRESSION: 1. No acute intracranial abnormality. Moderate cerebral atrophy. No definite acute cortical infarction. Significant periventricular and patchy subcortical white matter decreased attenuation probable due to chronic small vessel ischemic changes. 2. No cervical spine acute fracture or subluxation. Degenerative changes as described above. Electronically Signed   By: Natasha MeadLiviu  Pop M.D.   On: 07/19/2016 12:02   Dg Hip Unilat W Or Wo Pelvis 2-3 Views Right  Result Date: 07/19/2016 CLINICAL DATA:  Injury. EXAM: DG HIP (WITH OR WITHOUT PELVIS) 2-3V RIGHT COMPARISON:  No recent prior. FINDINGS: Angulated comminuted right intertrochanteric hip fracture noted. Diffuse osteopenia degenerative change. Aortoiliac atherosclerotic vascular calcification. Prominent amount of stool noted throughout the colon. IMPRESSION: Angulated comminuted intertrochanteric hip fracture on the right noted. Electronically Signed   By: Maisie Fushomas  Register   On: 07/19/2016 12:09    Positive ROS: All other systems have been reviewed and were otherwise negative with the exception of those mentioned in the HPI and as above.  Physical Exam: General: Alert, no acute distress Cardiovascular: No pedal edema Respiratory: No cyanosis, no use of accessory musculature GI: No organomegaly, abdomen is  soft and non-tender Skin: No lesions in the area of chief complaint Neurologic: Sensation  intact distally Psychiatric: Patient is competent for consent with normal mood and affect Lymphatic: No axillary or cervical lymphadenopathy  MUSCULOSKELETAL: Patient is somnolent following the dose of morphine.  Right leg is shortened and x-ray rotated.  There is severe pain with movement.  Skin is intact.  Neurovascular status is normal distally.  The left leg is normal to exam.  No other orthopedic injuries are noted.  Assessment: Comminuted intertrochanteric fracture right hip  Plan: Open reduction internal fixation right hip tomorrow with a trochanteric fixation nail    Valinda Hoar, MD [phone]   07/19/2016 6:27 PM

## 2016-07-19 NOTE — Progress Notes (Signed)
PHARMACIST - PHYSICIAN ORDER COMMUNICATION  CONCERNING: P&T Medication Policy on Herbal Medications  DESCRIPTION:  This patient's order for:  Aloe Vera Caps  has been noted.  This product(s) is classified as an "herbal" or natural product. Due to a lack of definitive safety studies or FDA approval, nonstandard manufacturing practices, plus the potential risk of unknown drug-drug interactions while on inpatient medications, the Pharmacy and Therapeutics Committee does not permit the use of "herbal" or natural products of this type within [facility].   ACTION TAKEN: The pharmacy department is unable to verify this order at this time and your patient has been informed of this safety policy. Please reevaluate patient's clinical condition at discharge and address if the herbal or natural product(s) should be resumed at that time.  Garlon HatchetJody Sharita Bienaime, PharmD Clinical Pharmacist  07/19/2016 2:44 PM

## 2016-07-20 ENCOUNTER — Inpatient Hospital Stay: Payer: Medicare Other | Admitting: Anesthesiology

## 2016-07-20 ENCOUNTER — Inpatient Hospital Stay: Payer: Medicare Other

## 2016-07-20 ENCOUNTER — Encounter
Admission: EM | Disposition: A | Discharge status: Skilled Nursing Facility | Payer: Self-pay | Source: Home / Self Care | Attending: Internal Medicine

## 2016-07-20 ENCOUNTER — Encounter: Payer: Self-pay | Admitting: Cardiology

## 2016-07-20 HISTORY — PX: INTRAMEDULLARY (IM) NAIL INTERTROCHANTERIC: SHX5875

## 2016-07-20 LAB — CBC
HEMATOCRIT: 34.6 % — AB (ref 35.0–47.0)
HEMOGLOBIN: 11.9 g/dL — AB (ref 12.0–16.0)
MCH: 32.7 pg (ref 26.0–34.0)
MCHC: 34.4 g/dL (ref 32.0–36.0)
MCV: 95 fL (ref 80.0–100.0)
Platelets: 134 10*3/uL — ABNORMAL LOW (ref 150–440)
RBC: 3.64 MIL/uL — ABNORMAL LOW (ref 3.80–5.20)
RDW: 14.1 % (ref 11.5–14.5)
WBC: 10.7 10*3/uL (ref 3.6–11.0)

## 2016-07-20 LAB — BASIC METABOLIC PANEL
ANION GAP: 6 (ref 5–15)
BUN: 31 mg/dL — ABNORMAL HIGH (ref 6–20)
CALCIUM: 7.8 mg/dL — AB (ref 8.9–10.3)
CHLORIDE: 106 mmol/L (ref 101–111)
CO2: 29 mmol/L (ref 22–32)
Creatinine, Ser: 0.97 mg/dL (ref 0.44–1.00)
GFR calc non Af Amer: 52 mL/min — ABNORMAL LOW (ref >60)
GLUCOSE: 123 mg/dL — AB (ref 65–99)
Potassium: 3.8 mmol/L (ref 3.5–5.1)
Sodium: 141 mmol/L (ref 135–145)

## 2016-07-20 LAB — CK: CK TOTAL: 2409 U/L — AB (ref 38–234)

## 2016-07-20 LAB — MRSA PCR SCREENING: MRSA by PCR: NEGATIVE

## 2016-07-20 SURGERY — FIXATION, FRACTURE, INTERTROCHANTERIC, WITH INTRAMEDULLARY ROD
Anesthesia: Spinal | Site: Hip | Laterality: Right | Wound class: Clean

## 2016-07-20 MED ORDER — BISACODYL 10 MG RE SUPP
10.0000 mg | Freq: Every day | RECTAL | Status: DC | PRN
  Administered 2016-07-22: 10 mg via RECTAL
  Filled 2016-07-20: qty 1

## 2016-07-20 MED ORDER — CLINDAMYCIN PHOSPHATE 600 MG/50ML IV SOLN
600.0000 mg | Freq: Three times a day (TID) | INTRAVENOUS | Status: AC
  Administered 2016-07-20 – 2016-07-21 (×3): 600 mg via INTRAVENOUS
  Filled 2016-07-20 (×3): qty 50

## 2016-07-20 MED ORDER — ONDANSETRON HCL 4 MG PO TABS: 4.0000 mg | ORAL_TABLET | Freq: Four times a day (QID) | ORAL | Status: DC | PRN

## 2016-07-20 MED ORDER — LACTATED RINGERS IV SOLN
INTRAVENOUS | Status: DC
  Administered 2016-07-20: 13:00:00 via INTRAVENOUS

## 2016-07-20 MED ORDER — ACETAMINOPHEN 325 MG PO TABS
650.0000 mg | ORAL_TABLET | Freq: Four times a day (QID) | ORAL | Status: DC | PRN
  Filled 2016-07-20: qty 2

## 2016-07-20 MED ORDER — FENTANYL CITRATE (PF) 100 MCG/2ML IJ SOLN
INTRAMUSCULAR | Status: AC
  Filled 2016-07-20: qty 2

## 2016-07-20 MED ORDER — SODIUM CHLORIDE 0.45 % IV SOLN
INTRAVENOUS | Status: DC
  Administered 2016-07-20: 17:00:00 via INTRAVENOUS

## 2016-07-20 MED ORDER — HYDROCODONE-ACETAMINOPHEN 5-325 MG PO TABS: 1.0000 | ORAL_TABLET | ORAL | Status: DC | PRN

## 2016-07-20 MED ORDER — FERROUS SULFATE 325 (65 FE) MG PO TABS
325.0000 mg | ORAL_TABLET | Freq: Every day | ORAL | Status: DC
  Administered 2016-07-21: 325 mg via ORAL
  Filled 2016-07-20: qty 1

## 2016-07-20 MED ORDER — ALUM & MAG HYDROXIDE-SIMETH 200-200-20 MG/5ML PO SUSP: 30.0000 mL | ORAL | Status: DC | PRN

## 2016-07-20 MED ORDER — PHENYLEPHRINE 40 MCG/ML (10ML) SYRINGE FOR IV PUSH (FOR BLOOD PRESSURE SUPPORT)
PREFILLED_SYRINGE | INTRAVENOUS | Status: AC
  Filled 2016-07-20: qty 10

## 2016-07-20 MED ORDER — OXYCODONE HCL 5 MG/5ML PO SOLN: 5.0000 mg | Freq: Once | ORAL | Status: DC | PRN

## 2016-07-20 MED ORDER — BUPIVACAINE-EPINEPHRINE (PF) 0.25% -1:200000 IJ SOLN
INTRAMUSCULAR | Status: AC
  Filled 2016-07-20: qty 30

## 2016-07-20 MED ORDER — PHENOL 1.4 % MT LIQD
1.0000 | OROMUCOSAL | Status: DC | PRN
  Filled 2016-07-20: qty 177

## 2016-07-20 MED ORDER — MORPHINE SULFATE (PF) 2 MG/ML IV SOLN
2.0000 mg | INTRAVENOUS | Status: DC | PRN
  Administered 2016-07-20: 2 mg via INTRAVENOUS
  Filled 2016-07-20: qty 1

## 2016-07-20 MED ORDER — PROMETHAZINE HCL 25 MG/ML IJ SOLN: 6.2500 mg | INTRAMUSCULAR | Status: DC | PRN

## 2016-07-20 MED ORDER — SODIUM CHLORIDE 0.9 % IV SOLN
INTRAVENOUS | Status: DC
  Administered 2016-07-20: 05:00:00 via INTRAVENOUS

## 2016-07-20 MED ORDER — MENTHOL 3 MG MT LOZG
1.0000 | LOZENGE | OROMUCOSAL | Status: DC | PRN
  Filled 2016-07-20: qty 9

## 2016-07-20 MED ORDER — NEOMYCIN-POLYMYXIN B GU 40-200000 IR SOLN
Status: DC | PRN
  Administered 2016-07-20: 2 mL

## 2016-07-20 MED ORDER — ACETAMINOPHEN 500 MG PO TABS
1000.0000 mg | ORAL_TABLET | Freq: Four times a day (QID) | ORAL | Status: AC
  Administered 2016-07-20 – 2016-07-21 (×3): 1000 mg via ORAL
  Filled 2016-07-20 (×4): qty 2

## 2016-07-20 MED ORDER — PROPOFOL 500 MG/50ML IV EMUL
INTRAVENOUS | Status: DC | PRN
  Administered 2016-07-20: 20 ug/kg/min via INTRAVENOUS

## 2016-07-20 MED ORDER — OXYCODONE HCL 5 MG PO TABS: 5.0000 mg | ORAL_TABLET | ORAL | Status: DC | PRN

## 2016-07-20 MED ORDER — BUPIVACAINE HCL (PF) 0.5 % IJ SOLN
INTRAMUSCULAR | Status: DC | PRN
  Administered 2016-07-20: 3 mL via INTRATHECAL

## 2016-07-20 MED ORDER — FENTANYL CITRATE (PF) 100 MCG/2ML IJ SOLN
INTRAMUSCULAR | Status: DC | PRN
  Administered 2016-07-20: 25 ug via INTRAVENOUS

## 2016-07-20 MED ORDER — HYDROCODONE-ACETAMINOPHEN 5-325 MG PO TABS
1.0000 | ORAL_TABLET | Freq: Four times a day (QID) | ORAL | Status: DC | PRN
  Administered 2016-07-20: 1 via ORAL
  Administered 2016-07-21: 2 via ORAL
  Filled 2016-07-20: qty 2
  Filled 2016-07-20: qty 1

## 2016-07-20 MED ORDER — CEFAZOLIN SODIUM-DEXTROSE 2-4 GM/100ML-% IV SOLN
2.0000 g | Freq: Three times a day (TID) | INTRAVENOUS | Status: AC
  Administered 2016-07-20 – 2016-07-21 (×3): 2 g via INTRAVENOUS
  Filled 2016-07-20 (×3): qty 100

## 2016-07-20 MED ORDER — ONDANSETRON HCL 4 MG/2ML IJ SOLN: 4.0000 mg | Freq: Four times a day (QID) | INTRAMUSCULAR | Status: DC | PRN

## 2016-07-20 MED ORDER — CLINDAMYCIN PHOSPHATE 600 MG/50ML IV SOLN
INTRAVENOUS | Status: AC
  Filled 2016-07-20: qty 50

## 2016-07-20 MED ORDER — PHENYLEPHRINE HCL 10 MG/ML IJ SOLN
INTRAMUSCULAR | Status: DC | PRN
Start: 2016-07-20 — End: 2016-07-20
  Administered 2016-07-20: 80 ug via INTRAVENOUS
  Administered 2016-07-20: 100 ug via INTRAVENOUS
  Administered 2016-07-20: 80 ug via INTRAVENOUS
  Administered 2016-07-20 (×2): 100 ug via INTRAVENOUS
  Administered 2016-07-20: 160 ug via INTRAVENOUS

## 2016-07-20 MED ORDER — PROPOFOL 10 MG/ML IV BOLUS
INTRAVENOUS | Status: DC | PRN
  Administered 2016-07-20 (×2): 20 mg via INTRAVENOUS

## 2016-07-20 MED ORDER — MORPHINE SULFATE (PF) 2 MG/ML IV SOLN: 1.0000 mg | INTRAVENOUS | Status: DC | PRN

## 2016-07-20 MED ORDER — METOCLOPRAMIDE HCL 10 MG PO TABS: 5.0000 mg | ORAL_TABLET | Freq: Three times a day (TID) | ORAL | Status: DC | PRN

## 2016-07-20 MED ORDER — ACETAMINOPHEN 650 MG RE SUPP: 650.0000 mg | Freq: Four times a day (QID) | RECTAL | Status: DC | PRN

## 2016-07-20 MED ORDER — FLEET ENEMA 7-19 GM/118ML RE ENEM
1.0000 | ENEMA | Freq: Once | RECTAL | Status: DC | PRN
Start: 2016-07-20 — End: 2016-07-23

## 2016-07-20 MED ORDER — GLYCOPYRROLATE 0.2 MG/ML IJ SOLN
INTRAMUSCULAR | Status: DC | PRN
  Administered 2016-07-20: .1 mg via INTRAVENOUS

## 2016-07-20 MED ORDER — ONDANSETRON HCL 4 MG/2ML IJ SOLN
INTRAMUSCULAR | Status: DC | PRN
  Administered 2016-07-20: 4 mg via INTRAVENOUS

## 2016-07-20 MED ORDER — CALCIUM CARBONATE-VITAMIN D 500-200 MG-UNIT PO TABS: 1.0000 | ORAL_TABLET | Freq: Every day | ORAL | Status: DC

## 2016-07-20 MED ORDER — DOCUSATE SODIUM 100 MG PO CAPS: 100.0000 mg | ORAL_CAPSULE | Freq: Two times a day (BID) | ORAL | Status: DC

## 2016-07-20 MED ORDER — SENNA 8.6 MG PO TABS
1.0000 | ORAL_TABLET | Freq: Two times a day (BID) | ORAL | Status: DC
  Administered 2016-07-20 – 2016-07-21 (×3): 8.6 mg via ORAL
  Filled 2016-07-20 (×4): qty 1

## 2016-07-20 MED ORDER — BUPIVACAINE-EPINEPHRINE (PF) 0.25% -1:200000 IJ SOLN
INTRAMUSCULAR | Status: DC | PRN
  Administered 2016-07-20: 30 mL via PERINEURAL

## 2016-07-20 MED ORDER — ZOLPIDEM TARTRATE 5 MG PO TABS: 5.0000 mg | ORAL_TABLET | Freq: Every evening | ORAL | Status: DC | PRN

## 2016-07-20 MED ORDER — PHENYLEPHRINE HCL 10 MG/ML IJ SOLN
INTRAMUSCULAR | Status: AC
  Filled 2016-07-20: qty 1

## 2016-07-20 MED ORDER — METOCLOPRAMIDE HCL 5 MG/ML IJ SOLN: 5.0000 mg | Freq: Three times a day (TID) | INTRAMUSCULAR | Status: DC | PRN

## 2016-07-20 MED ORDER — NEOMYCIN-POLYMYXIN B GU 40-200000 IR SOLN
Status: AC
  Filled 2016-07-20: qty 2

## 2016-07-20 MED ORDER — FENTANYL CITRATE (PF) 100 MCG/2ML IJ SOLN: 25.0000 ug | INTRAMUSCULAR | Status: DC | PRN

## 2016-07-20 MED ORDER — PROPOFOL 10 MG/ML IV BOLUS
INTRAVENOUS | Status: AC
  Filled 2016-07-20: qty 20

## 2016-07-20 MED ORDER — MORPHINE SULFATE (PF) 2 MG/ML IV SOLN: 0.5000 mg | INTRAVENOUS | Status: DC | PRN

## 2016-07-20 MED ORDER — HYDROCODONE-ACETAMINOPHEN 5-325 MG PO TABS: 1.0000 | ORAL_TABLET | Freq: Four times a day (QID) | ORAL | Status: DC | PRN

## 2016-07-20 MED ORDER — MAGNESIUM HYDROXIDE 400 MG/5ML PO SUSP
30.0000 mL | Freq: Every day | ORAL | Status: DC | PRN
  Administered 2016-07-21: 30 mL via ORAL
  Filled 2016-07-20: qty 30

## 2016-07-20 MED ORDER — OXYCODONE HCL 5 MG PO TABS: 5.0000 mg | ORAL_TABLET | Freq: Once | ORAL | Status: DC | PRN

## 2016-07-20 MED ORDER — MEPERIDINE HCL 25 MG/ML IJ SOLN: 6.2500 mg | INTRAMUSCULAR | Status: DC | PRN

## 2016-07-20 MED ORDER — POLYETHYLENE GLYCOL 3350 17 G PO PACK: 17.0000 g | PACK | Freq: Every day | ORAL | Status: DC | PRN

## 2016-07-20 MED ORDER — ENOXAPARIN SODIUM 30 MG/0.3ML ~~LOC~~ SOLN
30.0000 mg | SUBCUTANEOUS | Status: DC
  Administered 2016-07-21: 30 mg via SUBCUTANEOUS
  Filled 2016-07-20: qty 0.3

## 2016-07-20 SURGICAL SUPPLY — 47 items
BIT DRILL SPINE 4.0MMX260 (BIT) ×1
BIT DRILL SPINE 4.0X260 (BIT) ×2 IMPLANT
BLADE TI HELICAL 11.0 95 (BLADE) ×2 IMPLANT
BLADE TI HELICAL 11.0MM 95MM (BLADE) ×1
BNDG COHESIVE 4X5 TAN STRL (GAUZE/BANDAGES/DRESSINGS) ×3 IMPLANT
CANISTER SUCT 1200ML W/VALVE (MISCELLANEOUS) ×3 IMPLANT
CHLORAPREP W/TINT 26ML (MISCELLANEOUS) ×6 IMPLANT
DRAPE C-ARMOR (DRAPES) ×3 IMPLANT
DRAPE INCISE 23X17 IOBAN STRL (DRAPES) ×2
DRAPE INCISE IOBAN 23X17 STRL (DRAPES) ×1 IMPLANT
DRESSING ALLEVYN LIFE SACRUM (GAUZE/BANDAGES/DRESSINGS) ×3 IMPLANT
DRSG AQUACEL AG ADV 3.5X10 (GAUZE/BANDAGES/DRESSINGS) IMPLANT
DRSG AQUACEL AG ADV 3.5X14 (GAUZE/BANDAGES/DRESSINGS) ×3 IMPLANT
ELECT REM PT RETURN 9FT ADLT (ELECTROSURGICAL) ×3
ELECTRODE REM PT RTRN 9FT ADLT (ELECTROSURGICAL) ×1 IMPLANT
GAUZE PETRO XEROFOAM 1X8 (MISCELLANEOUS) IMPLANT
GAUZE SPONGE 4X4 12PLY STRL (GAUZE/BANDAGES/DRESSINGS) ×3 IMPLANT
GLOVE BIO SURGEON STRL SZ 6.5 (GLOVE) ×2 IMPLANT
GLOVE BIO SURGEON STRL SZ7.5 (GLOVE) ×3 IMPLANT
GLOVE BIO SURGEONS STRL SZ 6.5 (GLOVE) ×1
GLOVE BIOGEL PI IND STRL 7.0 (GLOVE) ×1 IMPLANT
GLOVE BIOGEL PI INDICATOR 7.0 (GLOVE) ×2
GLOVE INDICATOR 8.0 STRL GRN (GLOVE) ×3 IMPLANT
GLOVE SURG ORTHO 8.5 STRL (GLOVE) ×3 IMPLANT
GOWN STRL REUS W/ TWL LRG LVL3 (GOWN DISPOSABLE) ×1 IMPLANT
GOWN STRL REUS W/TWL LRG LVL3 (GOWN DISPOSABLE) ×2
GOWN STRL REUS W/TWL LRG LVL4 (GOWN DISPOSABLE) ×3 IMPLANT
GUIDEWIRE 3.2X400 (WIRE) ×6 IMPLANT
INACTIVE NO USAGE (Bolt) ×3 IMPLANT
KIT RM TURNOVER STRD PROC AR (KITS) ×3 IMPLANT
MAT BLUE FLOOR 46X72 FLO (MISCELLANEOUS) ×3 IMPLANT
NAIL TROCH FX 11/130D 170-S (Nail) ×3 IMPLANT
NEEDLE HYPO 18GX1.5 BLUNT FILL (NEEDLE) ×3 IMPLANT
NEEDLE SPNL 18GX3.5 QUINCKE PK (NEEDLE) ×3 IMPLANT
NS IRRIG 500ML POUR BTL (IV SOLUTION) ×3 IMPLANT
PACK HIP COMPR (MISCELLANEOUS) ×3 IMPLANT
REAMER ROD DEEP FLUTE 2.5X950 (INSTRUMENTS) ×3 IMPLANT
SOL PREP PVP 2OZ (MISCELLANEOUS) ×3
SOLUTION PREP PVP 2OZ (MISCELLANEOUS) ×1 IMPLANT
STAPLER SKIN PROX 35W (STAPLE) ×3 IMPLANT
SUCTION FRAZIER HANDLE 10FR (MISCELLANEOUS) ×2
SUCTION TUBE FRAZIER 10FR DISP (MISCELLANEOUS) ×1 IMPLANT
SUT VIC AB 0 CT1 36 (SUTURE) ×3 IMPLANT
SUT VIC AB 2-0 CT1 27 (SUTURE) ×2
SUT VIC AB 2-0 CT1 TAPERPNT 27 (SUTURE) ×1 IMPLANT
SYR 10ML LL (SYRINGE) ×3 IMPLANT
SYR 30ML LL (SYRINGE) ×3 IMPLANT

## 2016-07-20 NOTE — Transfer of Care (Signed)
Immediate Anesthesia Transfer of Care Note  Patient: Wendy Meyer  Procedure(s) Performed: Procedure(s): INTRAMEDULLARY (IM) NAIL INTERTROCHANTRIC (Right)  Patient Location: PACU  Anesthesia Type:Spinal  Level of Consciousness: patient cooperative and lethargic  Airway & Oxygen Therapy: Patient Spontanous Breathing and Patient connected to face mask oxygen  Post-op Assessment: Report given, vital signs stable  Post vital signs: Reviewed and stable  Last Vitals:  Vitals:   07/20/16 1313 07/20/16 1535  BP: (!) 120/58 99/78  Pulse: 92 98  Resp: 16 14  Temp: 37.3 C 36.5 C    Last Pain:  Vitals:   07/20/16 1130  TempSrc:   PainSc: Asleep      Patients Stated Pain Goal: 3 (07/20/16 1100)  Complications: No apparent anesthesia complications

## 2016-07-20 NOTE — Anesthesia Procedure Notes (Signed)
Spinal  Patient location during procedure: OR Start time: 07/20/2016 2:15 PM End time: 07/20/2016 2:25 PM Staffing Anesthesiologist: Emmie Niemann Performed: anesthesiologist  Preanesthetic Checklist Completed: patient identified, site marked, surgical consent, pre-op evaluation, timeout performed, IV checked, risks and benefits discussed and monitors and equipment checked Spinal Block Patient position: left lateral decubitus Prep: ChloraPrep Patient monitoring: heart rate, continuous pulse ox, blood pressure and cardiac monitor Approach: midline Location: L4-5 Injection technique: single-shot Needle Needle type: Introducer and Pencil-Tip  Needle gauge: 24 G Needle length: 9 cm Additional Notes Negative paresthesia. Negative blood return. Positive free-flowing CSF. Expiration date of kit checked and confirmed. Patient tolerated procedure well, without complications.

## 2016-07-20 NOTE — Progress Notes (Signed)
PT Cancellation Note  Patient Details Name: Wendy SitesGeraldine A Antos MRN: [id number] DOB: 1930-04-25   Cancelled Treatment:    Reason Eval/Treat Not Completed: Medical issues which prohibited therapy (Per chart review, patient scheduled for IM nailing to hip this date; will hold therapy services at this time.  Please re-consult post-op as medically appropriate.)   Lloyd Cullinan H. Manson PasseyBrown, PT, DPT, NCS 07/20/16, 8:07 AM [phone]

## 2016-07-20 NOTE — Clinical Social Work Placement (Signed)
   CLINICAL SOCIAL WORK PLACEMENT  NOTE  Date:  07/20/2016  Patient Details  Name: Wendy Meyer MRN: [id number] Date of Birth: 03/29/1930  Clinical Social Work is seeking post-discharge placement for this patient at the Skilled  Nursing Facility level of care (*CSW will initial, date and re-position this form in  chart as items are completed):  Yes   Patient/family provided with [facility] Clinical Social Work Department's list of facilities offering this level of care within the geographic area requested by the patient (or if unable, by the patient's family).  Yes   Patient/family informed of their freedom to choose among providers that offer the needed level of care, that participate in Medicare, Medicaid or managed care program needed by the patient, have an available bed and are willing to accept the patient.  Yes   Patient/family informed of [facility]'s ownership interest in [facility] and [facility], as well as of the fact that they are under no obligation to receive care at these facilities.  PASRR submitted to EDS on 07/19/16     PASRR number received on 07/19/16     Existing PASRR number confirmed on       FL2 transmitted to all facilities in geographic area requested by pt/family on 07/19/16     FL2 transmitted to all facilities within larger geographic area on       Patient informed that his/her managed care company has contracts with or will negotiate with certain facilities, including the following:            Patient/family informed of bed offers received.  Patient chooses bed at       Physician recommends and patient chooses bed at      Patient to be transferred to   on  .  Patient to be transferred to facility by       Patient family notified on   of transfer.  Name of family member notified:        PHYSICIAN       Additional Comment:    _______________________________________________ [name], [name], LCSW 07/20/2016, 5:42  PM

## 2016-07-20 NOTE — Clinical Social Work Note (Signed)
Clinical Social Work Assessment  Patient Details  Name: SAMANTHA RAGEN MRN: [id number] Date of Birth: 1929/12/20  Date of referral:  07/20/16               Reason for consult:  Facility Placement                Permission sought to share information with:  Chartered certified accountant granted to share information::  Yes, Verbal Permission Granted  Name::      [place]::   [place]   Relationship::     Contact Information:     Housing/Transportation Living arrangements for the past 2 months:  [place] of Information:  Patient, Other (Comment Required) Ambulance person) Patient Interpreter Needed:  None Criminal Activity/Legal Involvement Pertinent to Current Situation/Hospitalization:  No - Comment as needed Significant Relationships:  Other Family Members Lives with:  Self Do you feel safe going back to the place where you live?  Yes Need for family participation in patient care:  Yes (Comment)  Care giving concerns:  Patient lives alone in [place].    Social Worker assessment / plan:  Holiday representative (CSW) received SNF consult. Patient had surgery today for a hip fracture and PT is pending. CSW met with patient and her granddaughter in law Colletta [place] [phone] and grandson AL/ HPOA [phone] were at bedside. CSW introduced self and explained role of CSW department. Per grandchildren patient lives alone in [place] and will need to go to SNF. Grandchildren are familiar with SNF process because they have had other family members at [facility]. Per Colletta [place] patient's sister Joelene Millin is a long term care resident at [facility] and that would be their first choice. CSW explained that PT will work with patient after surgery and make recommendation of home health or SNF. FL2 complete and faxed out. CSW will follow up tomorrow with bed offers once PT eval is complete.   Employment status:  Retired Designer, industrial/product PT Recommendations:  Not assessed at this time [place] / Referral to community resources:  [place]  Patient/Family's Response to care:  Patient and family prefer for patient to go to [facility] for rehab after surgery.   Patient/Family's Understanding of and Emotional Response to Diagnosis, Current Treatment, and Prognosis:  Patient and family were very pleasant and thanked CSW for assistance.   Emotional Assessment Appearance:  Appears stated age Attitude/Demeanor/Rapport:    Affect (typically observed):  Accepting, Adaptable, Pleasant Orientation:  Oriented to Self, Oriented to Place, Oriented to  Time, Oriented to Situation Alcohol / Substance use:  Not Applicable Psych involvement (Current and /or in the community):  No (Comment)  Discharge Needs  Concerns to be addressed:  Discharge Planning Concerns Readmission within the last 30 days:  No Current discharge risk:  Dependent with Mobility Barriers to Discharge:  Continued Medical Work up   [employer], Veronia Beets, LCSW 07/20/2016, 5:43 PM

## 2016-07-20 NOTE — Progress Notes (Signed)
[facility] [place] [facility]   PATIENT NAME: Wendy Meyer    MRN#:  [id number]  DATE OF BIRTH:  [birth year]  SUBJECTIVE:  Hospital Day: 1 day Wendy Meyer is a 82 y.o. female presenting with Leg Injury and Fall .   Overnight events: No acute overnight events Interval Events: Complains of right-sided leg pain 6/10 worsened with movement otherwise no further complaints  REVIEW OF SYSTEMS:  CONSTITUTIONAL: No fever, fatigue or weakness.  EYES: No blurred or double vision.  EARS, NOSE, AND THROAT: No tinnitus or ear pain.  RESPIRATORY: No cough, shortness of breath, wheezing or hemoptysis.  CARDIOVASCULAR: No chest pain, orthopnea, edema.  GASTROINTESTINAL: No nausea, vomiting, diarrhea or abdominal pain.  GENITOURINARY: No dysuria, hematuria.  ENDOCRINE: No polyuria, nocturia,  HEMATOLOGY: No anemia, easy bruising or bleeding SKIN: No rash or lesion. MUSCULOSKELETAL: Right-sided leg pain given fracture otherwise No joint pain or arthritis.   NEUROLOGIC: No tingling, numbness, weakness.  PSYCHIATRY: No anxiety or depression.   DRUG ALLERGIES:   Allergies  Allergen Reactions  . Nsaids Other (See Comments)    Gastric upset.     VITALS:  Blood pressure (!) 120/58, pulse 92, temperature 99.2 F (37.3 C), resp. rate 16, height 5\' 7"  (1.702 m), weight 67.1 kg (148 lb), SpO2 95 %.  PHYSICAL EXAMINATION:  VITAL SIGNS: Vitals:   07/20/16 0415 07/20/16 1313  BP: (!) 103/51 (!) 120/58  Pulse: 94 92  Resp: 16 16  Temp: 98.2 F (36.8 C) 99.2 F (37.3 C)   GENERAL:82 y.o.female currently in no acute distress.  HEAD: Normocephalic, atraumatic.  EYES: Pupils equal, round, reactive to light. Extraocular muscles intact. No scleral icterus.  MOUTH: Moist mucosal membrane. Dentition intact. No abscess noted.  EAR, NOSE, THROAT: Clear without exudates. No external lesions.  NECK: Supple. No thyromegaly. No nodules. No JVD.  PULMONARY: Clear  to ascultation, without wheeze rails or rhonci. No use of accessory muscles, Good respiratory effort. good air entry bilaterally CHEST: Nontender to palpation.  CARDIOVASCULAR: S1 and S2. Regular rate and rhythm. No murmurs, rubs, or gallops. No edema. Pedal pulses 2+ bilaterally.  GASTROINTESTINAL: Soft, nontender, nondistended. No masses. Positive bowel sounds. No hepatosplenomegaly.  MUSCULOSKELETAL: No swelling, clubbing, or edema. Range of motion limited right lower extremity currently immobilized NEUROLOGIC: Cranial nerves II through XII are intact. No gross focal neurological deficits. Sensation intact. Reflexes intact.  SKIN: No ulceration, lesions, rashes, or cyanosis. Skin warm and dry. Turgor intact.  PSYCHIATRIC: Mood, affect within normal limits. The patient is awake, alert and oriented x 3. Insight, judgment intact.      LABORATORY PANEL:   CBC  Recent Labs Lab 07/20/16 0358  WBC 10.7  HGB 11.9*  HCT 34.6*  PLT 134*   ------------------------------------------------------------------------------------------------------------------  Chemistries   Recent Labs Lab 07/19/16 1502 07/20/16 0358  NA 140 141  K 4.1 3.8  CL 103 106  CO2 28 29  GLUCOSE 133* 123*  BUN 30* 31*  CREATININE 1.01* 0.97  CALCIUM 8.1* 7.8*  AST 71*  --   ALT 20  --   ALKPHOS 43  --   BILITOT 1.1  --    ------------------------------------------------------------------------------------------------------------------  Cardiac Enzymes  Recent Labs Lab 07/19/16 1118  TROPONINI 0.03*   ------------------------------------------------------------------------------------------------------------------  RADIOLOGY:  Dg Chest 1 View  Result Date: 07/19/2016 CLINICAL DATA:  Fall. EXAM: CHEST 1 VIEW COMPARISON:  05/25/2009 . FINDINGS: Mediastinum and hilar structures are normal. Basilar pulmonary interstitial prominence noted consistent with chronic interstitial  lung disease. No change  from prior exam . Heart size stable. No pulmonary venous congestion . IMPRESSION: Chronic interstitial lung disease. Electronically Signed   By: Maisie Fus  Register   On: 07/19/2016 12:07   Ct Head Wo Contrast  Result Date: 07/19/2016 CLINICAL DATA:  Confusion, weakness, found on the floor, neck pain EXAM: CT HEAD WITHOUT CONTRAST CT CERVICAL SPINE WITHOUT CONTRAST TECHNIQUE: Multidetector CT imaging of the head and cervical spine was performed following the standard protocol without intravenous contrast. Multiplanar CT image reconstructions of the cervical spine were also generated. COMPARISON:  10/30/ 10 FINDINGS: CT HEAD FINDINGS Brain: No intracranial hemorrhage, mass effect or midline shift. Moderate cerebral atrophy. Extensive periventricular and patchy subcortical white matter decreased attenuation probable due to small vessel ischemic changes. No acute cortical infarction. No mass lesion is noted on this unenhanced scan. Vascular: Atherosclerotic calcifications of carotid siphon. Atherosclerotic calcifications of vertebral arteries. Skull: No skull fracture is noted. Metallic dental artifact are noted. Sinuses/Orbits: No acute findings. There is previous right mastoidectomy Other: None CT CERVICAL SPINE FINDINGS Alignment: Normal alignment of the cervical spine Skull base and vertebrae: No acute fracture or subluxation. Degenerative changes are noted C1-C2 articulation. Mild anterior spurring at C5-C6 level. Soft tissues and spinal canal: Spinal canal is patent. No spinal canal hematoma. No prevertebral soft tissue swelling. Disc levels:  Minimal disc space flattening at C5-C6 level. Upper chest: There is no pneumothorax in visualized lung apices. Other: Atherosclerotic calcifications are noted bilateral carotid bifurcation. IMPRESSION: 1. No acute intracranial abnormality. Moderate cerebral atrophy. No definite acute cortical infarction. Significant periventricular and patchy subcortical white matter  decreased attenuation probable due to chronic small vessel ischemic changes. 2. No cervical spine acute fracture or subluxation. Degenerative changes as described above. Electronically Signed   By: Natasha Mead M.D.   On: 07/19/2016 12:02   Ct Cervical Spine Wo Contrast  Result Date: 07/19/2016 CLINICAL DATA:  Confusion, weakness, found on the floor, neck pain EXAM: CT HEAD WITHOUT CONTRAST CT CERVICAL SPINE WITHOUT CONTRAST TECHNIQUE: Multidetector CT imaging of the head and cervical spine was performed following the standard protocol without intravenous contrast. Multiplanar CT image reconstructions of the cervical spine were also generated. COMPARISON:  10/30/ 10 FINDINGS: CT HEAD FINDINGS Brain: No intracranial hemorrhage, mass effect or midline shift. Moderate cerebral atrophy. Extensive periventricular and patchy subcortical white matter decreased attenuation probable due to small vessel ischemic changes. No acute cortical infarction. No mass lesion is noted on this unenhanced scan. Vascular: Atherosclerotic calcifications of carotid siphon. Atherosclerotic calcifications of vertebral arteries. Skull: No skull fracture is noted. Metallic dental artifact are noted. Sinuses/Orbits: No acute findings. There is previous right mastoidectomy Other: None CT CERVICAL SPINE FINDINGS Alignment: Normal alignment of the cervical spine Skull base and vertebrae: No acute fracture or subluxation. Degenerative changes are noted C1-C2 articulation. Mild anterior spurring at C5-C6 level. Soft tissues and spinal canal: Spinal canal is patent. No spinal canal hematoma. No prevertebral soft tissue swelling. Disc levels:  Minimal disc space flattening at C5-C6 level. Upper chest: There is no pneumothorax in visualized lung apices. Other: Atherosclerotic calcifications are noted bilateral carotid bifurcation. IMPRESSION: 1. No acute intracranial abnormality. Moderate cerebral atrophy. No definite acute cortical infarction.  Significant periventricular and patchy subcortical white matter decreased attenuation probable due to chronic small vessel ischemic changes. 2. No cervical spine acute fracture or subluxation. Degenerative changes as described above. Electronically Signed   By: Natasha Mead M.D.   On: 07/19/2016 12:02   Dg Hip  Unilat W Or Wo Pelvis 2-3 Views Right  Result Date: 07/19/2016 CLINICAL DATA:  Injury. EXAM: DG HIP (WITH OR WITHOUT PELVIS) 2-3V RIGHT COMPARISON:  No recent prior. FINDINGS: Angulated comminuted right intertrochanteric hip fracture noted. Diffuse osteopenia degenerative change. Aortoiliac atherosclerotic vascular calcification. Prominent amount of stool noted throughout the colon. IMPRESSION: Angulated comminuted intertrochanteric hip fracture on the right noted. Electronically Signed   By: Maisie Fushomas  Register   On: 07/19/2016 12:09    EKG:   Orders placed or performed during the hospital encounter of 07/19/16  . EKG 12-Lead  . EKG 12-Lead    ASSESSMENT AND PLAN:   Wendy Meyer is a 82 y.o. female presenting with Leg Injury and Fall . Admitted 07/19/2016 : Day #: 1 day 1. Right intertrochanteric fracture: Surgery today, increased pain medication as she still in significant amount of pain, continue bowel regimen 2. Parkinson's continue Sinemet 3. Rhabdomyolysis continue fluids   All the records are reviewed and case discussed with Care Management/Social Workerr. Management plans discussed with the patient, family and they are in agreement.  CODE STATUS: full TOTAL TIME TAKING CARE OF THIS PATIENT: 33 minutes.   POSSIBLE D/C IN 2-3DAYS, DEPENDING ON CLINICAL CONDITION.   Hower,  Mardi MainlandDavid K M.D on 07/20/2016 at 2:49 PM  Between 7am to 6pm - Pager - [phone]  After 6pm: House Pager: - [phone]  [facility]  Office  [phone]  CC: Primary care physician; Rolm GalaGRANDIS, HEIDI, MD

## 2016-07-20 NOTE — Op Note (Signed)
DATE OF SURGERY:  07/20/2016  TIME: 3:37 PM  PATIENT NAME:  Wendy Meyer  AGE: 82 y.o.  PRE-OPERATIVE DIAGNOSIS:  fractured right hip, displaced intertrochanteric  POST-OPERATIVE DIAGNOSIS:  SAME  PROCEDURE:  INTRAMEDULLARY (IM) NAIL INTERTROCHANTRIC RIGHT HIP   SURGEON:  Baraka Klatt E  EBL:  100 cc  COMPLICATIONS:  None  OPERATIVE IMPLANTS: Synthes trochanteric femoral nail  130*/3311mm  with interlocking helical blade  95 mm and distal locking screw  38 mm.  PREOPERATIVE INDICATIONS:  Wendy SitesGeraldine A Addo is a 82 y.o. year old who fell and suffered a hip fracture. She was brought into the ER and then admitted and optimized and then elected for surgical intervention.    The risks benefits and alternatives were discussed with the patient including but not limited to the risks of nonoperative treatment, versus surgical intervention including infection, bleeding, nerve injury, malunion, nonunion, hardware prominence, hardware failure, need for hardware removal, blood clots, cardiopulmonary complications, morbidity, mortality, among others, and they were willing to proceed.    OPERATIVE PROCEDURE:  The patient was brought to the operating room and placed in the supine position.  Spinal anesthesia was administered, with a foley. She was placed on the fracture table.  Closed reduction was performed under C-arm guidance. The length of the femur was also measured using fluoroscopy. Time out was then performed after sterile prep and drape. She received preoperative antibiotics.  Incision was made proximal to the greater trochanter. A guidewire was placed in the appropriate position. Confirmation was made on AP and lateral views. The above-named nail was opened. I opened the proximal femur with a reamer. I then placed the nail by hand easily down. I did not need to ream the femur.  Once the nail was completely seated, I placed a guidepin into the femoral head into the center center  position through a second incision.  I measured the length, and then reamed the lateral cortex and up into the head. I then placed the helical blade. Slight compression was applied. Anatomic fixation achieved. Bone quality was mediocre.  I then secured the proximal interlock.  The distal locking screw was then placed and after confirming the position of the fracture fragments and hardware I then removed the instruments, and took final C-arm pictures AP and lateral the entire length of the leg. Anatomic reconstruction was achieved, and the wounds were irrigated copiously and closed with Vicryl  followed by staples and Aquacel for the skin. Sponge and needle count were correct.   The patient was awakened and returned to PACU in stable and satisfactory condition. There no complications and the patient tolerated the procedure well.  She will be partial weightbearing as tolerated, and will be on Lovenox  For DVT prophylaxis.     Valinda HoarHoward E Jumar Greenstreet, M.D.

## 2016-07-20 NOTE — Progress Notes (Signed)
Patient back from OR. Alert and oriented to person, place, disoriented time, and situation. Patient unable to move feet or hips with some sensation. Patient placed on 2LO2 for desats in the 80% while asleep. Patient not complaining of any pain. Patient has auqacel dressing with scant drainage. Ice applied. TEDS and foot pumps on.   Harvie HeckMelanie Nirav Sweda, RN

## 2016-07-20 NOTE — H&P (Signed)
THE PATIENT WAS SEEN PRIOR TO SURGERY TODAY.  HISTORY, ALLERGIES, HOME MEDICATIONS AND OPERATIVE PROCEDURE WERE REVIEWED. RISKS AND BENEFITS OF SURGERY DISCUSSED WITH PATIENT AGAIN.  NO CHANGES FROM INITIAL HISTORY AND PHYSICAL NOTED.    

## 2016-07-20 NOTE — Anesthesia Preprocedure Evaluation (Signed)
Anesthesia Evaluation  Patient identified by MRN, date of birth, ID band Patient awake    Reviewed: Allergy & Precautions, NPO status , Patient's Chart, lab work & pertinent test results  History of Anesthesia Complications Negative for: history of anesthetic complications  Airway Mallampati: II  TM Distance: >3 FB Neck ROM: Full    Dental  (+) Poor Dentition   Pulmonary neg pulmonary ROS, neg sleep apnea, neg COPD,    breath sounds clear to auscultation- rhonchi (-) wheezing      Cardiovascular (-) hypertension(-) angina(-) CAD and (-) Past MI  Rhythm:Regular Rate:Normal - Systolic murmurs and - Diastolic murmurs    Neuro/Psych Parkinson's disease, mild dementia  negative psych ROS   GI/Hepatic negative GI ROS, Neg liver ROS,   Endo/Other  negative endocrine ROSneg diabetes  Renal/GU negative Renal ROS     Musculoskeletal negative musculoskeletal ROS (+)   Abdominal (+) - obese,   Peds  Hematology negative hematology ROS (+)   Anesthesia Other Findings Past Medical History: No date: Parkinson disease (HCC)   Reproductive/Obstetrics                             Anesthesia Physical Anesthesia Plan  ASA: II  Anesthesia Plan: Spinal   Post-op Pain Management:    Induction:   Airway Management Planned: Natural Airway  Additional Equipment:   Intra-op Plan:   Post-operative Plan:   Informed Consent: I have reviewed the patients History and Physical, chart, labs and discussed the procedure including the risks, benefits and alternatives for the proposed anesthesia with the patient or authorized representative who has indicated his/her understanding and acceptance.   Dental advisory given  Plan Discussed with: Anesthesiologist and CRNA  Anesthesia Plan Comments:         Lab Results  Component Value Date   WBC 10.7 07/20/2016   HGB 11.9 (L) 07/20/2016   HCT 34.6 (L)  07/20/2016   MCV 95.0 07/20/2016   PLT 134 (L) 07/20/2016    Anesthesia Quick Evaluation

## 2016-07-21 ENCOUNTER — Encounter: Payer: Self-pay | Admitting: Specialist

## 2016-07-21 LAB — CBC
HEMATOCRIT: 28.2 % — AB (ref 35.0–47.0)
HEMOGLOBIN: 9.7 g/dL — AB (ref 12.0–16.0)
MCH: 32.2 pg (ref 26.0–34.0)
MCHC: 34.5 g/dL (ref 32.0–36.0)
MCV: 93.4 fL (ref 80.0–100.0)
Platelets: 121 10*3/uL — ABNORMAL LOW (ref 150–440)
RBC: 3.02 MIL/uL — AB (ref 3.80–5.20)
RDW: 13.9 % (ref 11.5–14.5)
WBC: 8.2 10*3/uL (ref 3.6–11.0)

## 2016-07-21 LAB — BASIC METABOLIC PANEL
ANION GAP: 4 — AB (ref 5–15)
BUN: 27 mg/dL — ABNORMAL HIGH (ref 6–20)
CHLORIDE: 106 mmol/L (ref 101–111)
CO2: 30 mmol/L (ref 22–32)
CREATININE: 0.79 mg/dL (ref 0.44–1.00)
Calcium: 7.4 mg/dL — ABNORMAL LOW (ref 8.9–10.3)
GFR calc non Af Amer: >60 mL/min (ref >60)
Glucose, Bld: 128 mg/dL — ABNORMAL HIGH (ref 65–99)
POTASSIUM: 3.6 mmol/L (ref 3.5–5.1)
SODIUM: 140 mmol/L (ref 135–145)

## 2016-07-21 LAB — CK: CK TOTAL: 1493 U/L — AB (ref 38–234)

## 2016-07-21 MED ORDER — MIRTAZAPINE 15 MG PO TABS
7.5000 mg | ORAL_TABLET | Freq: Every day | ORAL | Status: DC
  Administered 2016-07-21: 7.5 mg via ORAL
  Filled 2016-07-21: qty 1

## 2016-07-21 MED ORDER — ENSURE ENLIVE PO LIQD
237.0000 mL | Freq: Two times a day (BID) | ORAL | Status: DC
  Administered 2016-07-21 – 2016-07-23 (×4): 237 mL via ORAL

## 2016-07-21 MED ORDER — ENOXAPARIN SODIUM 40 MG/0.4ML ~~LOC~~ SOLN
40.0000 mg | SUBCUTANEOUS | Status: DC
  Administered 2016-07-22 – 2016-07-23 (×2): 40 mg via SUBCUTANEOUS
  Filled 2016-07-21 (×2): qty 0.4

## 2016-07-21 NOTE — Progress Notes (Signed)
Physical Therapy Treatment Patient Details Name: Wendy SitesGeraldine A Perno MRN: [id number] DOB: 1929-11-25 Today's Date: 07/21/2016    History of Present Illness Pt is an 82 y.o. female s/p fall with R hip pain and imaging showing acute R intertrochanteric fx.  Pt s/p IMN intertrochanteric R hip 07/20/16.  PMH includes Parkinson's.    PT Comments    Pt able to stand 3x's with mod assist x2 and use of RW but with increasing fatigue and decreased time standing each attempt.  Pt continues to require cueing for PWB'ing status, posture in standing, and correct technique with transfers.  Will continue to progress pt with strengthening and progressive functional mobility per pt tolerance.   Follow Up Recommendations  SNF     Equipment Recommendations  Rolling walker with 5" wheels    Recommendations for Other Services       Precautions / Restrictions Precautions Precautions: Fall Restrictions Weight Bearing Restrictions: Yes RLE Weight Bearing: Partial weight bearing    Mobility  Bed Mobility Overal bed mobility: Needs Assistance Bed Mobility: Supine to Sit;Sit to Supine     Supine to sit: Max assist;+2 for physical assistance Sit to supine: Max assist;+2 for physical assistance   General bed mobility comments: assist for trunk and B LE's; vc's for technique  Transfers Overall transfer level: Needs assistance Equipment used: Rolling walker (2 wheeled) Transfers: Sit to/from Stand Sit to Stand: Mod assist;+2 physical assistance         General transfer comment: x3 trials; vc's and tactile cues for hand and feet placement and PWB'ing status  Ambulation/Gait                 Stairs            Wheelchair Mobility    Modified Rankin (Stroke Patients Only)       Balance Overall balance assessment: Needs assistance Sitting-balance support: Bilateral upper extremity supported;Feet supported Sitting balance-Leahy Scale: Fair Sitting balance - Comments: CGA  once positioned securely on edge of bed (otherwise mod assist prior to that) Postural control: Posterior lean;Left lateral lean Standing balance support: Bilateral upper extremity supported (on RW) Standing balance-Leahy Scale: Poor Standing balance comment: posterior lean (vc's required for upright posture)                    Cognition Arousal/Alertness: Awake/alert Behavior During Therapy: Anxious Overall Cognitive Status: Impaired/Different from baseline (Oriented to person but not place/time/situation.  Appeared to be hallucinating some)                      Exercises Total Joint Exercises Heel Slides: AAROM;Strengthening;10 reps;Supine;Right Hip ABduction/ADduction: AAROM;Strengthening;10 reps;Supine;Right  Vc's required for technique.    General Comments  Pt agreeable to PT session.  Pt's granddaughter in law present for session.      Pertinent Vitals/Pain Pain Assessment: 0-10 Pain Score: 6  Pain Location: R hip Pain Descriptors / Indicators: Tender;Sore Pain Intervention(s): Limited activity within patient's tolerance;Monitored during session;Premedicated before session;Repositioned  Vitals (HR and O2) stable and WFL throughout treatment session.     Home Living Family/patient expects to be discharged to:: Private residence Living Arrangements: Alone Available Help at Discharge: Family Type of Home: House Home Access: Stairs to enter   Home Layout: One level Home Equipment: None      Prior Function Level of Independence: Needs assistance    ADL's / Homemaking Assistance Needed: Granddaughter-in-law assists with morning care bathing, dressing, and home management. Reports she will not  be able to do that any longer. Pt. was in the process of being evaluated by [facility]. Pt. received meals on wheels.     PT Goals (current goals can now be found in the care plan section) Acute Rehab PT Goals Patient Stated Goal: to have less pain PT Goal Formulation:  With patient Time For Goal Achievement: 08/04/16 Potential to Achieve Goals: Fair Progress towards PT goals: Progressing toward goals    Frequency    BID      PT Plan Current plan remains appropriate    Co-evaluation             End of Session Equipment Utilized During Treatment: Gait belt;Oxygen (2 L/min via nasal cannula) Activity Tolerance: Patient limited by pain;Patient limited by fatigue Patient left: in bed;with call bell/phone within reach;with bed alarm set;with family/visitor present;with SCD's reapplied (B heels elevated via towel rolls)     Time: [id number] PT Time Calculation (min) (ACUTE ONLY): 25 min  Charges:  $Therapeutic Exercise: 8-22 mins $Therapeutic Activity: 8-22 mins                    G CodesHendricks Limes:      Micaylah Bertucci 07/21/2016, 5:12 PM Hendricks LimesEmily Alaiyah Bollman, PT [phone]

## 2016-07-21 NOTE — Progress Notes (Signed)
PT is recommending SNF. Clinical Education officer, museum (CSW) met with patent's granddaughter in law [place] and presented bed offers. She chose [facility]. [facility] admissions coordinator at [facility] is aware of accepted bed offer. CSW will continue to follow and assist as needed.   [employer], LCSW [phone]

## 2016-07-21 NOTE — Progress Notes (Signed)
Pt has an order for lovenox 30mg  q 24hr post op. Pt crcl is >4830ml/min and weight is >45kg, therefore will transition pt to 40mg  q 24h per protocol  Ashyra Cantin D Milessa Hogan, Pharm.D, BCPS Clinical Pharmacist

## 2016-07-21 NOTE — Progress Notes (Signed)
Subjective: 1 Day Post-Op Procedure(s) (LRB): INTRAMEDULLARY (IM) NAIL INTERTROCHANTRIC (Right)    Patient reports pain as moderate.  hgb 9.7.  oob in chair.  Alert and oriented. Plan to go to SNF in 1-2 days  Objective:   VITALS:   Vitals:   07/21/16 0344 07/21/16 0802  BP: (!) 113/52 (!) 121/53  Pulse: 87 84  Resp:  16  Temp:  98 F (36.7 C)    Neurologically intact ABD soft Neurovascular intact Sensation intact distally Intact pulses distally Dorsiflexion/Plantar flexion intact Incision: scant drainage  LABS  Recent Labs  07/19/16 1502 07/20/16 0358 07/21/16 0324  HGB 13.1 11.9* 9.7*  HCT 37.7 34.6* 28.2*  WBC 14.1* 10.7 8.2  PLT 157 134* 121*     Recent Labs  07/19/16 1502 07/20/16 0358 07/21/16 0324  NA 140 141 140  K 4.1 3.8 3.6  BUN 30* 31* 27*  CREATININE 1.01* 0.97 0.79  GLUCOSE 133* 123* 128*     Recent Labs  07/19/16 1118 07/19/16 1502  INR 1.02 1.06     Assessment/Plan: 1 Day Post-Op Procedure(s) (LRB): INTRAMEDULLARY (IM) NAIL INTERTROCHANTRIC (Right)   Advance diet Up with therapy D/C IV fluids Discharge to SNF

## 2016-07-21 NOTE — Evaluation (Signed)
Physical Therapy Evaluation Patient Details Name: Wendy Meyer MRN: [id number] DOB: 12-05-1929 Today's Date: 07/21/2016   History of Present Illness  Pt is an 82 y.o. female s/p fall with R hip pain and imaging showing acute R intertrochanteric fx.  Pt s/p IMN intertrochanteric R hip 07/20/16.  PMH includes Parkinson's.  Clinical Impression  Prior to hospital admission, pt was ambulating independently within home.  Pt lives alone in 1 level home with multiple stairs to enter (pt's granddaughter in law was assisting pt with bathing, dressing, cleaning and plan for pt to be evaluated for PACE program).  Currently pt is requiring 2 assist for bed mobility and transfer to chair with RW (pivoted on L LE d/t unable to maintain PWB'ing status on R LE).  Pt would benefit from skilled PT to address noted impairments and functional limitations.  Recommend pt discharge to STR when medically appropriate.    Follow Up Recommendations SNF    Equipment Recommendations  Rolling walker with 5" wheels    Recommendations for Other Services       Precautions / Restrictions Precautions Precautions: Fall Restrictions Weight Bearing Restrictions: Yes RLE Weight Bearing: Partial weight bearing      Mobility  Bed Mobility Overal bed mobility: Needs Assistance Bed Mobility: Supine to Sit     Supine to sit: Max assist;+2 for physical assistance     General bed mobility comments: assist for trunk and B LE's; vc's for technique  Transfers Overall transfer level: Needs assistance Equipment used: Rolling walker (2 wheeled) Transfers: Sit to/from [employer] CorporationStand;Stand Pivot Transfers Sit to Stand: Mod assist;+2 physical assistance Stand pivot transfers: Mod assist;+2 physical assistance       General transfer comment: vc's and tactile cues for hand and feet placement  Ambulation/Gait   Ambulation Distance (Feet): 0 Feet         General Gait Details: pt able to take small step forward with R LE  but unable to maintain PWB'ing to advance L LE (using RW and cueing for technique) so deferred ambulation d/t safety concerns  Stairs            Wheelchair Mobility    Modified Rankin (Stroke Patients Only)       Balance Overall balance assessment: Needs assistance Sitting-balance support: Bilateral upper extremity supported;Feet supported Sitting balance-Leahy Scale: Fair Sitting balance - Comments: CGA once positioned securely on edge of bed (otherwise mod assist prior to that) Postural control: Posterior lean;Left lateral lean (to off-weight R hip d/t pain)                                   Pertinent Vitals/Pain Pain Assessment: 0-10 Pain Score: 6  Pain Location: R hip Pain Descriptors / Indicators: Tender;Sore Pain Intervention(s): Limited activity within patient's tolerance;Monitored during session;Premedicated before session;Repositioned;Ice applied  Vitals (HR and O2 on 2 L/min via nasal cannula) stable and WFL throughout treatment session.    Home Living Family/patient expects to be discharged to:: Private residence Living Arrangements: Alone   Type of Home: House Home Access: Stairs to enter   [employer] CorporationEntrance Stairs-Number of Steps: 4 with B rail (unable to reach both) plus 1 plus 1 STE no railing Home Layout: One level Home Equipment: None      Prior Function Level of Independence: Needs assistance   Gait / Transfers Assistance Needed: Pt independent without AD in home (only left home for MD appts)  ADL's / Homemaking  Assistance Needed: Pt's granddaughter in law assisting with dressing, bathing, and cleaning.  Comments: Pt's granddaughter in law reporting that pt was supposed to be evaluated for PACE program (but unable to perform d/t hospital admission).     Hand Dominance        Extremity/Trunk Assessment   Upper Extremity Assessment Upper Extremity Assessment: Generalized weakness    Lower Extremity Assessment Lower Extremity  Assessment: RLE deficits/detail RLE Deficits / Details: hip flexion at least 2+/5; knee flexion/extension at least 2+/5; DF at least 3/5 (all limited d/t R hip/thigh pain) RLE: Unable to fully assess due to pain       Communication   Communication: HOH  Cognition Arousal/Alertness: Awake/alert Behavior During Therapy: WFL for tasks assessed/performed Overall Cognitive Status:  (Oriented to person.)                      General Comments   Nursing cleared pt for participation in physical therapy.  Discussed bedrest orders with nursing (nothing in chart otherwise indicating need for bedrest currently) and per discussion with nursing, OK for pt to get up and participate in PT.  Pt and pt's granddaughter in law agreeable to PT session.     Exercises Total Joint Exercises Heel Slides: AAROM;Strengthening;Both;10 reps;Supine Hip ABduction/ADduction: AAROM;Strengthening;Both;10 reps;Supine  Pt requiring vc's for technique; limited R LE ROM d/t R hip pain.   Assessment/Plan    PT Assessment Patient needs continued PT services  PT Problem List Decreased strength;Decreased activity tolerance;Decreased balance;Decreased mobility;Decreased knowledge of use of DME;Decreased knowledge of precautions;Pain          PT Treatment Interventions DME instruction;Gait training;Stair training;Functional mobility training;Therapeutic activities;Therapeutic exercise;Balance training;Patient/family education    PT Goals (Current goals can be found in the Care Plan section)  Acute Rehab PT Goals Patient Stated Goal: to have less R hip pain PT Goal Formulation: With patient Time For Goal Achievement: 08/04/16 Potential to Achieve Goals: Fair    Frequency BID   Barriers to discharge Decreased caregiver support      Co-evaluation               End of Session Equipment Utilized During Treatment: Gait belt;Oxygen (2 L/min via nasal cannula) Activity Tolerance: Patient limited by  pain;Patient limited by fatigue Patient left: in chair;with call bell/phone within reach;with chair alarm set;with family/visitor present;with SCD's reapplied (B heels elevated via towel rolls) Nurse Communication: Mobility status;Precautions;Weight bearing status         Time: [id number] PT Time Calculation (min) (ACUTE ONLY): 35 min   Charges:   PT Evaluation $PT Eval Low Complexity: 1 Procedure PT Treatments $Therapeutic Exercise: 8-22 mins   PT G CodesHendricks Meyer:        Brace Meyer 07/21/2016, 12:38 PM Wendy LimesEmily Itzael Liptak, PT [phone]

## 2016-07-21 NOTE — Evaluation (Signed)
Occupational Therapy Evaluation Patient Details Name: Wendy SitesGeraldine A Meyer MRN: [id number] DOB: [birth year] Today's Date: 07/21/2016    History of Present Illness Pt. is an 82 y.o. female  who [facility]N repair of an acute right intertrochanteric Fx. Pt. PMHx includes: Parkinson's Disease.   Clinical Impression   Pt. Is an 82 y.o. Female who was [facility] for IMN repair of an acute right intertrochanteric Fx. Pt. Presents with weakness, pain, decreased activity tolerance, HOH, and difficulty with functional mobility which hinder her ability to complete ADL functioning. Pt. could benefit from skilled OT services for ADL training, A/E training, UE there. Ex, and pt. education about work simplification, home modification, and DME. Pt. could benefit from SNF level of care upon discharge. Pt. Could benefit from follow-up OT services.    Follow Up Recommendations  SNF    Equipment Recommendations       Recommendations for Other Services       Precautions / Restrictions Precautions Precautions: Fall Restrictions Weight Bearing Restrictions: Yes RLE Weight Bearing: Partial weight bearing              ADL Overall ADL's : Needs assistance/impaired Eating/Feeding: Maximal assistance (Poor appetite, pt. granddaughter in-law is feeding pt.)                   Lower Body Dressing: Total assistance                 General ADL Comments: Pt. requires assist with all ADL tasks. Pt. education was provided about A/E use for LE ADLs.     Vision     Perception     Praxis      Pertinent Vitals/Pain Pain Assessment: 0-10 Pain Score: 6  Pain Location: R hip Pain Descriptors / Indicators: Tender;Sore Pain Intervention(s): Limited activity within patient's tolerance;Monitored during session;Premedicated before session;Repositioned;Ice applied     Hand Dominance Right   Extremity/Trunk Assessment Upper Extremity Assessment Upper Extremity  Assessment: Generalized weakness         Communication Communication Communication: HOH   Cognition Arousal/Alertness: Awake/alert Behavior During Therapy: WFL for tasks assessed/performed Overall Cognitive Status: Within Functional Limits for tasks assessed                     General Comments       Exercises       Shoulder Instructions      Home Living Family/patient expects to be discharged to:: Private residence Living Arrangements: Alone Available Help at Discharge: Family Type of Home: House Home Access: Stairs to enter [employer] CorporationEntrance Stairs-Number of Steps: 4 with B rail (unable to reach both) plus 1 plus 1 STE no railing   Home Layout: One level     Bathroom Shower/Tub: Tub/shower unit Shower/tub characteristics: Curtain       Home Equipment: None          Prior Functioning/Environment Level of Independence: Needs assistance  Gait / Transfers Assistance Needed: Pt independent without AD in home (only left home for MD appts) ADL's / Homemaking Assistance Needed: Granddaughter-in-law assists with morning care bathing, dressing, and home management. Reports she will not be able to do that any longer. Pt. was in the process of being evaluated by PACE. Pt. received meals on wheels.   Comments: Pt's granddaughter in law reporting that pt was supposed to be evaluated for PACE program (but unable to perform d/t hospital admission).        OT  Problem List: Decreased strength;Decreased knowledge of use of DME or AE;Pain;Decreased activity tolerance   OT Treatment/Interventions: Self-care/ADL training;Therapeutic exercise;Energy conservation;Patient/family education;Therapeutic activities;DME and/or AE instruction    OT Goals(Current goals can be found in the care plan section) Acute Rehab OT Goals Patient Stated Goal: To return home OT Goal Formulation: With patient Potential to Achieve Goals: Good  OT Frequency: Min 1X/week   Barriers to D/C:             Co-evaluation              End of Session    Activity Tolerance: Patient tolerated treatment well Patient left: in chair;with call bell/phone within reach;with chair alarm set   Time: [id number] OT Time Calculation (min): 27 min Charges:  OT General Charges $OT Visit: 1 Procedure OT Evaluation $OT Eval Moderate Complexity: 1 Procedure G-Codes:    Olegario MessierElaine Marishka Rentfrow, MS, OTR/L 07/21/2016, 3:36 PM

## 2016-07-21 NOTE — Anesthesia Postprocedure Evaluation (Signed)
Anesthesia Post Note  Patient: Lennice SitesGeraldine A Taffe  Procedure(s) Performed: Procedure(s) (LRB): INTRAMEDULLARY (IM) NAIL INTERTROCHANTRIC (Right)  Patient location during evaluation: Nursing Unit Anesthesia Type: Spinal Level of consciousness: awake, awake and alert and oriented Pain management: pain level controlled Vital Signs Assessment: post-procedure vital signs reviewed and stable Respiratory status: spontaneous breathing, nonlabored ventilation and respiratory function stable Cardiovascular status: blood pressure returned to baseline and stable Anesthetic complications: no     Last Vitals:  Vitals:   07/21/16 0343 07/21/16 0344  BP: (!) 105/45 (!) 113/52  Pulse: 89 87  Resp: 18   Temp: 37.3 C     Last Pain:  Vitals:   07/21/16 0350  TempSrc:   PainSc: Asleep                 Ginger CarneStephanie Kimsey Demaree

## 2016-07-21 NOTE — Progress Notes (Signed)
[facility] [place] [facility]   PATIENT NAME: Wendy JarredGeraldine Garden    MRN#:  [id number]  DATE OF BIRTH:  1930/02/23  SUBJECTIVE:  Hospital Day: 2 days Wendy Meyer is a 82 y.o. female presenting with Leg Injury and Fall .   Overnight events: No acute overnight events Interval Events: improved right-sided leg pain   REVIEW OF SYSTEMS:  CONSTITUTIONAL: No fever, fatigue or weakness.  EYES: No blurred or double vision.  EARS, NOSE, AND THROAT: No tinnitus or ear pain.  RESPIRATORY: No cough, shortness of breath, wheezing or hemoptysis.  CARDIOVASCULAR: No chest pain, orthopnea, edema.  GASTROINTESTINAL: No nausea, vomiting, diarrhea or abdominal pain.  GENITOURINARY: No dysuria, hematuria.  ENDOCRINE: No polyuria, nocturia,  HEMATOLOGY: No anemia, easy bruising or bleeding SKIN: No rash or lesion. MUSCULOSKELETAL: Right-sided leg improved  No joint pain or arthritis.   NEUROLOGIC: No tingling, numbness, weakness.  PSYCHIATRY: No anxiety or depression.   DRUG ALLERGIES:   Allergies  Allergen Reactions  . Nsaids Other (See Comments)    Gastric upset.     VITALS:  Blood pressure (!) 121/53, pulse 84, temperature 98 F (36.7 C), temperature source Oral, resp. rate 16, height 5\' 7"  (1.702 m), weight 67.1 kg (148 lb), SpO2 100 %.  PHYSICAL EXAMINATION:  VITAL SIGNS: Vitals:   07/21/16 0344 07/21/16 0802  BP: (!) 113/52 (!) 121/53  Pulse: 87 84  Resp:  16  Temp:  98 F (36.7 C)   GENERAL:82 y.o.female currently in no acute distress.  HEAD: Normocephalic, atraumatic.  EYES: Pupils equal, round, reactive to light. Extraocular muscles intact. No scleral icterus.  MOUTH: Moist mucosal membrane. Dentition intact. No abscess noted.  EAR, NOSE, THROAT: Clear without exudates. No external lesions.  NECK: Supple. No thyromegaly. No nodules. No JVD.  PULMONARY: Clear to ascultation, without wheeze rails or rhonci. No use of accessory muscles,  Good respiratory effort. good air entry bilaterally CHEST: Nontender to palpation.  CARDIOVASCULAR: S1 and S2. Regular rate and rhythm. No murmurs, rubs, or gallops. No edema. Pedal pulses 2+ bilaterally.  GASTROINTESTINAL: Soft, nontender, nondistended. No masses. Positive bowel sounds. No hepatosplenomegaly.  MUSCULOSKELETAL: No swelling, clubbing, or edema. Range of motion limited right lower extremity currently immobilized NEUROLOGIC: Cranial nerves II through XII are intact. No gross focal neurological deficits. Sensation intact. Reflexes intact.  SKIN: No ulceration, lesions, rashes, or cyanosis. Skin warm and dry. Turgor intact.  PSYCHIATRIC: Mood, affect within normal limits. The patient is awake, alert and oriented x 3. Insight, judgment intact.      LABORATORY PANEL:   CBC  Recent Labs Lab 07/21/16 0324  WBC 8.2  HGB 9.7*  HCT 28.2*  PLT 121*   ------------------------------------------------------------------------------------------------------------------  Chemistries   Recent Labs Lab 07/19/16 1502  07/21/16 0324  NA 140  < > 140  K 4.1  < > 3.6  CL 103  < > 106  CO2 28  < > 30  GLUCOSE 133*  < > 128*  BUN 30*  < > 27*  CREATININE 1.01*  < > 0.79  CALCIUM 8.1*  < > 7.4*  AST 71*  --   --   ALT 20  --   --   ALKPHOS 43  --   --   BILITOT 1.1  --   --   < > = values in this interval not displayed. ------------------------------------------------------------------------------------------------------------------  Cardiac Enzymes  Recent Labs Lab 07/19/16 1118  TROPONINI 0.03*   ------------------------------------------------------------------------------------------------------------------  RADIOLOGY:  Dg  Hip Operative Unilat W Or W/o Pelvis Right  Result Date: 07/20/2016 CLINICAL DATA:  Post right femur intertrochanteric fracture repair EXAM: OPERATIVE right HIP (WITH PELVIS IF PERFORMED) 3 VIEWS TECHNIQUE: Fluoroscopic spot image(s) were  submitted for interpretation post-operatively. COMPARISON:  07/19/2016 FINDINGS: Three views of the right hip submitted. The patient is status post intraoperative repair of proximal right femoral fracture. There is intramedullary rod and metallic locking pin is noted in right femoral neck. There is anatomic alignment. A locking screw is noted in proximal shaft of the right femur. IMPRESSION: Status post intraoperative repair of proximal right femoral fracture. Intramedullary rod and metallic locking pin with anatomic alignment. Fluoroscopy time was 37 seconds.  Please see the operative report. Electronically Signed   By: Natasha MeadLiviu  Pop M.D.   On: 07/20/2016 15:38    EKG:   Orders placed or performed during the hospital encounter of 07/19/16  . EKG 12-Lead  . EKG 12-Lead    ASSESSMENT AND PLAN:   Wendy Meyer is a 82 y.o. female presenting with Leg Injury and Fall . Admitted 07/19/2016 : Day #: 2 days 1. Right intertrochanteric fracture: POD #1,pain medication, continue bowel regimen 2. Parkinson's continue Sinemet 3. Rhabdomyolysis continue fluids 4. Poor appetite: remeron   All the records are reviewed and case discussed with Care Management/Social Workerr. Management plans discussed with the patient, family and they are in agreement.  CODE STATUS: full TOTAL TIME TAKING CARE OF THIS PATIENT: 33 minutes.   POSSIBLE D/C IN 2-3DAYS, DEPENDING ON CLINICAL CONDITION.   Sherian Valenza,  Mardi MainlandDavid K M.D on 07/21/2016 at 3:40 PM  Between 7am to 6pm - Pager - [phone]  After 6pm: House Pager: - [phone]  [facility]  Office  [phone]  CC: Primary care physician; Rolm GalaGRANDIS, HEIDI, MD

## 2016-07-21 NOTE — Progress Notes (Signed)
Foley removed at [id number]

## 2016-07-22 LAB — BASIC METABOLIC PANEL
Anion gap: 6 (ref 5–15)
BUN: 20 mg/dL (ref 6–20)
CHLORIDE: 104 mmol/L (ref 101–111)
CO2: 29 mmol/L (ref 22–32)
Calcium: 7.5 mg/dL — ABNORMAL LOW (ref 8.9–10.3)
Creatinine, Ser: 0.79 mg/dL (ref 0.44–1.00)
GFR calc Af Amer: >60 mL/min (ref >60)
GFR calc non Af Amer: >60 mL/min (ref >60)
GLUCOSE: 114 mg/dL — AB (ref 65–99)
POTASSIUM: 3.3 mmol/L — AB (ref 3.5–5.1)
Sodium: 139 mmol/L (ref 135–145)

## 2016-07-22 LAB — CBC
HCT: 29.1 % — ABNORMAL LOW (ref 35.0–47.0)
HEMOGLOBIN: 10 g/dL — AB (ref 12.0–16.0)
MCH: 31.9 pg (ref 26.0–34.0)
MCHC: 34.4 g/dL (ref 32.0–36.0)
MCV: 92.8 fL (ref 80.0–100.0)
Platelets: 140 10*3/uL — ABNORMAL LOW (ref 150–440)
RBC: 3.14 MIL/uL — AB (ref 3.80–5.20)
RDW: 14 % (ref 11.5–14.5)
WBC: 9.1 10*3/uL (ref 3.6–11.0)

## 2016-07-22 LAB — CK: Total CK: 1032 U/L — ABNORMAL HIGH (ref 38–234)

## 2016-07-22 MED ORDER — HYDROCODONE-ACETAMINOPHEN 5-325 MG PO TABS: 1.0000 | ORAL_TABLET | Freq: Four times a day (QID) | ORAL | 0 refills | Status: DC | PRN

## 2016-07-22 MED ORDER — SODIUM CHLORIDE 0.9 % IV SOLN
INTRAVENOUS | Status: DC
  Administered 2016-07-22 – 2016-07-23 (×2): via INTRAVENOUS

## 2016-07-22 MED ORDER — MIRTAZAPINE 7.5 MG PO TABS: 7.5000 mg | ORAL_TABLET | Freq: Every day | ORAL | 0 refills | Status: DC

## 2016-07-22 MED ORDER — FERROUS SULFATE 325 (65 FE) MG PO TABS: 325.0000 mg | ORAL_TABLET | Freq: Every day | ORAL | 3 refills | Status: DC

## 2016-07-22 MED ORDER — ENOXAPARIN SODIUM 40 MG/0.4ML ~~LOC~~ SOLN: 40.0000 mg | SUBCUTANEOUS | 0 refills | Status: DC

## 2016-07-22 MED ORDER — ACETAMINOPHEN 325 MG PO TABS: 650.0000 mg | ORAL_TABLET | Freq: Four times a day (QID) | ORAL | 0 refills | Status: AC | PRN

## 2016-07-22 MED ORDER — ENSURE ENLIVE PO LIQD: 237.0000 mL | Freq: Two times a day (BID) | ORAL | 12 refills | Status: DC

## 2016-07-22 MED ORDER — BISACODYL 10 MG RE SUPP
10.0000 mg | Freq: Every day | RECTAL | 0 refills | Status: DC | PRN
Start: 2016-07-22 — End: 2016-08-15

## 2016-07-22 NOTE — Progress Notes (Signed)
Physical Therapy Treatment Patient Details Name: Wendy Meyer MRN: [id number] DOB: [birth year] Today's Date: 07/22/2016    History of Present Illness Pt is an 82 y.o. female s/p fall with R hip pain and imaging showing acute R intertrochanteric fx.  Pt s/p IMN intertrochanteric R hip 07/20/16.  PMH includes Parkinson's.    PT Comments    Pt soundly sleeping in chair upon PT arrival requiring vc's and tactile cues to wake up.  Pt awake for functional mobility part of session but once pt back in bed pt soundly sleeping again (arousable with vc's and tactile cues briefly before falling back asleep; HR and O2 vitals WFL).  Pt requiring increased time for activities but even with confusion pt able to follow directions better this afternoon.  Will continue to progress pt with strengthening and progressive functional mobility per pt tolerance.   Follow Up Recommendations  SNF     Equipment Recommendations  Rolling walker with 5" wheels    Recommendations for Other Services       Precautions / Restrictions Precautions Precautions: Fall Restrictions Weight Bearing Restrictions: Yes RLE Weight Bearing: Partial weight bearing    Mobility  Bed Mobility Overal bed mobility: Needs Assistance Bed Mobility: Sit to Supine       Sit to supine: Max assist;+2 for physical assistance   General bed mobility comments: assist for trunk and B LE's; vc's for technique  Transfers Overall transfer level: Needs assistance Equipment used: Rolling walker (2 wheeled) Transfers: Sit to/from [employer]Stand;Stand Pivot Transfers Sit to Stand: Min assist;Mod assist;+2 physical assistance Stand pivot transfers: Min assist;Mod assist;+2 physical assistance       General transfer comment: vc's and tactile cues for hand and feet placement and PWB'ing status; pt able to take a couple steps with heavy cueing for increasing UE support on RW to maintain PWB'ing status (pt otherwise pivoting on L  LE)  Ambulation/Gait             General Gait Details: deferred d/t pt very tired and wanting to lay down in bed   Stairs            Wheelchair Mobility    Modified Rankin (Stroke Patients Only)       Balance Overall balance assessment: Needs assistance Sitting-balance support: Bilateral upper extremity supported;Feet supported Sitting balance-Leahy Scale: Fair Sitting balance - Comments: static sitting   Standing balance support: Bilateral upper extremity supported (on RW) Standing balance-Leahy Scale: Poor Standing balance comment: initial posterior lean (vc's required for upright posture)                    Cognition Arousal/Alertness: Lethargic (Sleeping at rest but wakeful for functional mobility) Behavior During Therapy: Anxious Overall Cognitive Status: Impaired/Different from baseline (Oriented to person only)                      Exercises  Deferred d/t pt not staying awake enough to meaningfully participate.    General Comments        Pertinent Vitals/Pain Pain Assessment: Faces Faces Pain Scale: Hurts little more Pain Location: R hip Pain Descriptors / Indicators: Tender;Sore Pain Intervention(s): Limited activity within patient's tolerance;Monitored during session;Repositioned    Home Living                      Prior Function            PT Goals (current goals can now be found in the  care plan section) Acute Rehab PT Goals Patient Stated Goal: to have less pain PT Goal Formulation: With patient Time For Goal Achievement: 08/04/16 Potential to Achieve Goals: Good Progress towards PT goals: Progressing toward goals    Frequency    BID      PT Plan Current plan remains appropriate    Co-evaluation             End of Session Equipment Utilized During Treatment: Gait belt Activity Tolerance: Patient limited by fatigue;Patient limited by lethargy Patient left: in bed;with call bell/phone within  reach;with bed alarm set;with family/visitor present;with SCD's reapplied (B heels elevated via pillows)     Time: 9147-82951145-1208 PT Time Calculation (min) (ACUTE ONLY): 23 min  Charges:  $Therapeutic Activity: 23-37 mins                    G CodesHendricks Limes:      Raekwon Winkowski 07/22/2016, 3:05 PM Hendricks LimesEmily Freddi Forster, PT [phone]

## 2016-07-22 NOTE — Evaluation (Signed)
Clinical/Bedside Swallow Evaluation Patient Details  Name: Wendy SitesGeraldine A Landess MRN: [id number] Date of Birth: 03-29-1930  Today's Date: 07/22/2016 Time: SLP Start Time (ACUTE ONLY): 1200 SLP Stop Time (ACUTE ONLY): 1300 SLP Time Calculation (min) (ACUTE ONLY): 60 min  Past Medical History:  Past Medical History:  Diagnosis Date  . Parkinson disease [facility](HCC)    Past Surgical History:  Past Surgical History:  Procedure Laterality Date  . INTRAMEDULLARY (IM) NAIL INTERTROCHANTERIC Right 07/20/2016   Procedure: INTRAMEDULLARY (IM) NAIL INTERTROCHANTRIC;  Surgeon: Deeann SaintHoward Miller, MD;  Location: [facility] ORS;  Service: Orthopedics;  Laterality: Right;   HPI:  Pt is a 82 y.o. female with a known history of Parkinson's disease and Dementia who comes to the emergency room after she had a mechanical fall at home. History is obtained from patient's grandson and his wife are her healthcare power of attorney. Patient was seen by his grandson last night at 5:30 PM. This morning when his wife went to go check on the patient up putting the routine she was found laying on the floor. Her night light was on and it seems patient was down since middle of the night. Duration unknown. Her evaluation in the emergency room found she is right intertrochanteric fracture along with elevated CPK suggesting acute rhabdomyolysis. She is being admitted; pt was seen by surgery for fractured right hip, displaced intertrochanteric - repaired. Per family she has been having issues with memory and mixes up her heart disease medication at night. She also has underlying mild dementia. Pt has been more lethargic today per Sister in room. Of note, Sister stated pt has had a decline in functional status in the past 2-3 years even prior to death of husband ~2 years ago. Pt has decreased desire for oral intake, more sweets. She drinks more effectively than eats full meals per Sister's report. Pt has been given an appetite stimulant to address  this.    Assessment / Plan / Recommendation Clinical Impression  Pt appears at min increased risk for aspiration secondary to her declined Cognitive status(baseline Dementia) as well as her Oral phase dysphagia exhibited during this evaluation today. Pt also presented w/ increased sleepiness/lethargy and required mod-max verbal/tactile cues throughout this evaluation. Pt exhibited oral phase deficits c/b reduced oral awareness for opening mouth to tactile stim of spoon and boluses, oral holding w/ prolonged A-P transfer time, and reduced oral movements for bolus managment. Once bolus material moved posteriorly and into the pharynx, pt exhibited a pharyngeal swallow and appeared to clear the bolus material adequately. No trials of thin liquids or solids were given at this time d/t the lethargy and Cognitive decline presentation as such can increase risk for aspiration and choking. Recommend a Dysphagia level 1 diet w/ Nectar consistency liquids via TSP; strict aspiration precautions and feeding support; meds in Puree - Crushed as able. Monitor for readiness for trials to upgrade diet post discharge to next facility as appropriate. Recommend Dietician f/u for supplemental support d/t h/o reduced oral intake in past 2-3 years.     Aspiration Risk  Mild aspiration risk (Cognitive status; lethargy)    Diet Recommendation  Dysphagia level 1 w/ Nectar liquids; strict aspiration precautions and feeding support at all meals - check for oral clearing/swallowing w/ all intake.   Medication Administration: Crushed with puree    Other  Recommendations Recommended Consults:  (Dietician consult) Oral Care Recommendations: Oral care BID;Staff/trained caregiver to provide oral care Other Recommendations: Order thickener from pharmacy;Prohibited food (jello, ice cream, thin  soups);Remove water pitcher;Have oral suction available   Follow up Recommendations Skilled Nursing facility      Frequency and Duration min  3x week  2 weeks       Prognosis Prognosis for Safe Diet Advancement: Fair Barriers to Reach Goals: Cognitive deficits;Severity of deficits (lethargy)      Swallow Study   General Date of Onset: 07/19/16 HPI: Pt is a 82 y.o. female with a known history of Parkinson's disease and Dementia who comes to the emergency room after she had a mechanical fall at home. History is obtained from patient's grandson and his wife are her healthcare power of attorney. Patient was seen by his grandson last night at 5:30 PM. This morning when his wife went to go check on the patient up putting the routine she was found laying on the floor. Her night light was on and it seems patient was down since middle of the night. Duration unknown. Her evaluation in the emergency room found she is right intertrochanteric fracture along with elevated CPK suggesting acute rhabdomyolysis. She is being admitted; pt was seen by surgery for fractured right hip, displaced intertrochanteric - repaired. Per family she has been having issues with memory and mixes up her heart disease medication at night. She also has underlying mild dementia. Pt has been more lethargic today per Sister in room. Of note, Sister stated pt has had a decline in functional status in the past 2-3 years even prior to death of husband ~2 years ago. Pt has decreased desire for oral intake, more sweets. She drinks more effectively than eats full meals per Sister's report. Pt has been given an appetite stimulant to address this.  Type of Study: Bedside Swallow Evaluation Previous Swallow Assessment: none indicated Diet Prior to this Study: Regular;Thin liquids (at home) Temperature Spikes Noted: No (wbc 9.1) Respiratory Status: Nasal cannula (2 liters) History of Recent Intubation: No Behavior/Cognition: Confused;Lethargic/Drowsy;Distractible;Requires cueing;Doesn't follow directions Oral Cavity Assessment: Dry Oral Care Completed by SLP: Recent completion by  staff Oral Cavity - Dentition: Adequate natural dentition;Missing dentition (few) Vision:  (n/a) Self-Feeding Abilities: Total assist Patient Positioning: Upright in bed Baseline Vocal Quality: Low vocal intensity (phonations intermittently) Volitional Cough: Cognitively unable to elicit Volitional Swallow: Unable to elicit    Oral/Motor/Sensory Function Overall Oral Motor/Sensory Function:  (appeared grossly wfl w/ bolus management)   Ice Chips Ice chips: Not tested   Thin Liquid Thin Liquid: Not tested Other Comments: too sleepy    Nectar Thick Nectar Thick Liquid: Impaired Presentation: Spoon (fed; 8-9 trials) Oral Phase Impairments: Poor awareness of bolus (reduced oral opening for boluses) Oral phase functional implications: Prolonged oral transit;Oral holding Pharyngeal Phase Impairments:  (none) Other Comments: moderate verbal/tactile cues given   Honey Thick Honey Thick Liquid: Not tested   Puree Puree: Impaired Presentation: Spoon (fed; 8 trials) Oral Phase Impairments: Reduced labial seal;Poor awareness of bolus (reduced oral opening) Oral Phase Functional Implications: Prolonged oral transit;Oral holding Pharyngeal Phase Impairments:  (none)   Solid   GO   Solid: Not tested Other Comments: d/t Cognitive status and sleepiness         Jerilynn SomKatherine Audwin Semper, MS, CCC-SLP Rotunda Worden 07/22/2016,3:57 PM

## 2016-07-22 NOTE — Care Management Important Message (Signed)
Important Message  Patient Details  Name: Wendy Meyer MRN: [id number] Date of Birth: 10-24-1929   Medicare Important Message Given:  Yes    Collie SiadAngela Vinessa Macconnell, RN 07/22/2016, 1:21 PM

## 2016-07-22 NOTE — Discharge Summary (Signed)
[facility] [place] [facility]   PATIENT NAME: Wendy Meyer    MR#:  [id number]  DATE OF BIRTH:  Feb 18, 1930  DATE OF ADMISSION:  07/19/2016  ADMITTING PHYSICIAN: Enedina FinnerSona [name], MD  DATE OF DISCHARGE: 07/22/16  PRIMARY CARE PHYSICIAN: Rolm GalaGRANDIS, HEIDI, MD    ADMISSION DIAGNOSIS:  Closed displaced intertrochanteric fracture of right femur, initial encounter (HCC) [S72.141A]  DISCHARGE DIAGNOSIS:  Active Problems:   Hip fracture (HCC) rhabdomyolysis   SECONDARY DIAGNOSIS:       Past Medical History:  Diagnosis Date  . Parkinson disease [facility](HCC)     HOSPITAL COURSE:  Wendy Meyer  is a 82 y.o. female admitted 07/19/2016 with chief complaint Leg Injury and Fall . Please see H&P performed by Enedina FinnerSona [name], MD for further information. Patient presented with the above symptoms, found to have right femur fracture. Evaluated by ortho underwent INTRAMEDULLARY (IM) NAIL INTERTROCHANTRIC RIGHT HIPwithout complication. She was also noted to have elevated CK on admission - improved with IV fluid hydration.  The family expressed concern over poor appetite, and willing to start patient on remeron  DISCHARGE CONDITIONS:   stable  CONSULTS OBTAINED:  Treatment Team:  Deeann SaintHoward Miller, MD  DRUG ALLERGIES:        Allergies  Allergen Reactions  . Nsaids Other (See Comments)    Gastric upset.     DISCHARGE MEDICATIONS:      Current Discharge Medication List       START taking these medications   Details  acetaminophen (TYLENOL) 325 MG tablet Take 2 tablets (650 mg total) by mouth every 6 (six) hours as needed for mild pain (or Fever >/= 101). Qty: 30 tablet, Refills: 0    bisacodyl (DULCOLAX) 10 MG suppository Place 1 suppository (10 mg total) rectally daily as needed for moderate constipation. Qty: 12 suppository, Refills: 0    enoxaparin (LOVENOX) 40 MG/0.4ML injection Inject 0.4 mLs (40 mg total) into the skin  daily. Qty: 5.6 mL, Refills: 0    feeding supplement, ENSURE ENLIVE, (ENSURE ENLIVE) LIQD Take 237 mLs by mouth 2 (two) times daily between meals. Qty: 237 mL, Refills: 12    ferrous sulfate 325 (65 FE) MG tablet Take 1 tablet (325 mg total) by mouth daily with breakfast. Qty: 30 tablet, Refills: 3    HYDROcodone-acetaminophen (NORCO/VICODIN) 5-325 MG tablet Take 1-2 tablets by mouth every 6 (six) hours as needed for moderate pain. Qty: 30 tablet, Refills: 0    mirtazapine (REMERON) 7.5 MG tablet Take 1 tablet (7.5 mg total) by mouth at bedtime. Qty: 30 tablet, Refills: 0         CONTINUE these medications which have NOT CHANGED   Details  Aloe Vera 500 MG CAPS Take 1 capsule by mouth every evening.    Calcium Carbonate-Vitamin D (CALTRATE 600+D) 600-400 MG-UNIT tablet Take 1 tablet by mouth daily.    carbidopa-levodopa (SINEMET CR) 50-200 MG tablet Take 1 tablet by mouth 2 (two) times daily.    citalopram (CELEXA) 20 MG tablet Take 20 mg by mouth daily.    raloxifene (EVISTA) 60 MG tablet Take 60 mg by mouth daily.         DISCHARGE INSTRUCTIONS:    DIET:  Regular diet  DISCHARGE CONDITION:  Stable  ACTIVITY:  Activity as tolerated  OXYGEN:  Home Oxygen: No.   Oxygen Delivery: room air  DISCHARGE LOCATION:  nursing home   If you experience worsening of your admission symptoms, develop shortness of breath, life threatening  emergency, suicidal or homicidal thoughts you must seek medical attention immediately by calling 911 or calling your MD immediately  if symptoms less severe.  You Must read complete instructions/literature along with all the possible adverse reactions/side effects for all the Medicines you take and that have been prescribed to you. Take any new Medicines after you have completely understood and accpet all the possible adverse reactions/side effects.   Please note  You were cared for by a hospitalist during your  hospital stay. If you have any questions about your discharge medications or the care you received while you were in the hospital after you are discharged, you can call the unit and asked to speak with the hospitalist on call if the hospitalist that took care of you is not available. Once you are discharged, your primary care physician will handle any further medical issues. Please note that NO REFILLS for any discharge medications will be authorized once you are discharged, as it is imperative that you return to your primary care physician (or establish a relationship with a primary care physician if you do not have one) for your aftercare needs so that they can reassess your need for medications and monitor your lab values.    On the day of Discharge:   VITAL SIGNS:  Blood pressure (!) 144/68, pulse (!) 123, temperature 99.1 F (37.3 C), temperature source Oral, resp. rate (!) 32, height 5\' 7"  (1.702 m), weight 67.1 kg (148 lb), SpO2 90 %.  I/O:   Intake/Output Summary (Last 24 hours) at 07/22/16 0910 Last data filed at 07/21/16 1900  Gross per 24 hour  Intake              120 ml  Output                0 ml  Net              120 ml    PHYSICAL EXAMINATION:  GENERAL:  82 y.o.-year-old patient lying in the bed with no acute distress.  EYES: Pupils equal, round, reactive to light and accommodation. No scleral icterus. Extraocular muscles intact.  HEENT: Head atraumatic, normocephalic. Oropharynx and nasopharynx clear.  NECK:  Supple, no jugular venous distention. No thyroid enlargement, no tenderness.  LUNGS: Normal breath sounds bilaterally, no wheezing, rales,rhonchi or crepitation. No use of accessory muscles of respiration.  CARDIOVASCULAR: S1, S2 normal. No murmurs, rubs, or gallops.  ABDOMEN: Soft, non-tender, non-distended. Bowel sounds present. No organomegaly or mass.  EXTREMITIES: No pedal edema, cyanosis, or clubbing.  NEUROLOGIC: Cranial nerves II through XII are  intact. Muscle strength 5/5 in all extremities. Sensation intact. Gait not checked.  PSYCHIATRIC: The patient is alert and oriented x 1.  SKIN: No obvious rash, lesion, or ulcer.   DATA REVIEW:   CBC  Last Labs    Recent Labs Lab 07/22/16 0436  WBC 9.1  HGB 10.0*  HCT 29.1*  PLT 140*      Chemistries   Last Labs    Recent Labs Lab 07/19/16 1502  07/22/16 0436  NA 140  < > 139  K 4.1  < > 3.3*  CL 103  < > 104  CO2 28  < > 29  GLUCOSE 133*  < > 114*  BUN 30*  < > 20  CREATININE 1.01*  < > 0.79  CALCIUM 8.1*  < > 7.5*  AST 71*  --   --   ALT 20  --   --  ALKPHOS 43  --   --   BILITOT 1.1  --   --   < > = values in this interval not displayed.    Cardiac Enzymes  Last Labs    Recent Labs Lab 07/19/16 1118  TROPONINI 0.03*      Microbiology Results         Results for orders placed or performed during the hospital encounter of 07/19/16  MRSA PCR Screening     Status: None   Collection Time: 07/20/16  9:53 AM  Result Value Ref Range Status   MRSA by PCR NEGATIVE NEGATIVE Final    Comment:        The GeneXpert MRSA Assay (FDA approved for NASAL specimens only), is one component of a comprehensive MRSA colonization surveillance program. It is not intended to diagnose MRSA infection nor to guide or monitor treatment for MRSA infections.    RADIOLOGY:   Imaging Results (Last 48 hours)  Dg Hip Operative Unilat W Or W/o Pelvis Right  Result Date: 07/20/2016 CLINICAL DATA:  Post right femur intertrochanteric fracture repair EXAM: OPERATIVE right HIP (WITH PELVIS IF PERFORMED) 3 VIEWS TECHNIQUE: Fluoroscopic spot image(s) were submitted for interpretation post-operatively. COMPARISON:  07/19/2016 FINDINGS: Three views of the right hip submitted. The patient is status post intraoperative repair of proximal right femoral fracture. There is intramedullary rod and metallic locking pin is noted in right femoral neck. There is anatomic  alignment. A locking screw is noted in proximal shaft of the right femur. IMPRESSION: Status post intraoperative repair of proximal right femoral fracture. Intramedullary rod and metallic locking pin with anatomic alignment. Fluoroscopy time was 37 seconds.  Please see the operative report. Electronically Signed   By: Natasha Mead M.D.   On: 07/20/2016 15:38      Management plans discussed with the patient, family and they are in agreement.  CODE STATUS:          Code Status Orders             Start     Ordered   07/20/16 0249  Full code  Continuous     07/20/16 0248                      Code Status History    Date Active Date Inactive Code Status Order ID Comments User Context   07/19/2016 12:44 PM 07/20/2016  2:48 AM Full Code [id number]  Deeann Saint, MD ED          Advance Directive Documentation   Flowsheet Row Most Recent Value  Type of Advance Directive  Healthcare Power of Attorney  Pre-existing out of facility DNR order (yellow form or pink MOST form)  No data  "MOST" Form in Place?  No data      TOTAL TIME TAKING CARE OF THIS PATIENT: 33 minutes.    Veyda Kaufman,  Mardi Mainland.D on 07/22/2016 at 9:10 AM  Between 7am to 6pm - Pager - [phone]  After 6pm go to [email or web address] - Social research officer, government  [facility] [place] [facility]  Office  [phone]  CC: Primary care physician; Rolm Gala, MD

## 2016-07-22 NOTE — Plan of Care (Signed)
Problem: Bowel/Gastric: Goal: Will not experience complications related to bowel motility Outcome: Progressing Pt making poor progress toward goals at thsi time. Pt has been sleepy all day and only able to participate with providers is with nearly constant stimulation to stay awake.

## 2016-07-22 NOTE — Progress Notes (Signed)
Physical Therapy Treatment Patient Details Name: Wendy Meyer Today's Date: 07/22/2016    History of Present Illness Pt is an 82 y.o. female s/p fall with R hip pain and imaging showing acute R intertrochanteric fx.  Pt s/p IMN intertrochanteric R hip 07/20/16.  PMH includes Parkinson's.    PT Comments    Pt requesting to sit on edge of bed and then get to commode upon PT entering room (pt falling asleep between talking with therapist initially but was consistently alert upon participating in functional mobility).  Pt with some difficulty following commands d/t confusion so unable to attempt ambulation d/t safety concerns maintaining PWB'ing status but pt was able to stand multiple times and transfer to commode with 2 assist and cueing.  Will continue to progress pt with strengthening and progressive functional mobility per pt tolerance.   Follow Up Recommendations  SNF     Equipment Recommendations  Rolling walker with 5" wheels    Recommendations for Other Services       Precautions / Restrictions Precautions Precautions: Fall Restrictions Weight Bearing Restrictions: Yes RLE Weight Bearing: Partial weight bearing    Mobility  Bed Mobility Overal bed mobility: Needs Assistance Bed Mobility: Supine to Sit     Supine to sit: Mod assist;+2 for physical assistance;HOB elevated     General bed mobility comments: assist for trunk and B LE's; vc's for technique  Transfers Overall transfer level: Needs assistance Equipment used: Rolling walker (2 wheeled) Transfers: Sit to/from [employer];Stand Pivot Transfers Sit to Stand: Min assist;Mod assist;+2 physical assistance (x2 trials from bed; x1 trial from commode) Stand pivot transfers: Mod assist;+2 physical assistance (stand pivot to L without AD)       General transfer comment: vc's and tactile cues for hand and feet placement and PWB'ing status  Ambulation/Gait   Ambulation Distance  (Feet): 0 Feet         General Gait Details: deferred d/t pt's difficulty following commands to keep PWB'ing status   Stairs            Wheelchair Mobility    Modified Rankin (Stroke Patients Only)       Balance Overall balance assessment: Needs assistance Sitting-balance support: Bilateral upper extremity supported;Feet supported Sitting balance-Leahy Scale: Poor Sitting balance - Comments: posterior L lean (min assist)   Standing balance support: Bilateral upper extremity supported (on RW) Standing balance-Leahy Scale: Poor Standing balance comment: initial posterior lean (vc's required for upright posture)                    Cognition Arousal/Alertness: Lethargic (pt initially sleeping with intermittent wakeful periods but was awake for functional mobility) Behavior During Therapy: Anxious Overall Cognitive Status: Impaired/Different from baseline (Oriented to person only)                      Exercises      General Comments   Nursing cleared pt for participation in physical therapy.  Pt agreeable to PT session.  Nursing aware pt's HR higher this AM and that pt soundly sleeping a lot.  Pt's sister present end of session.      Pertinent Vitals/Pain Pain Assessment: Faces Faces Pain Scale: Hurts a little bit Pain Location: R hip Pain Intervention(s): Limited activity within patient's tolerance;Monitored during session;Repositioned  HR 115-118 bpm during session.  O2 95% beginning of session and 91% on room air end of session (pt without nasal cannula on upon PT entering  room; nursing notified of pt's O2 sats and cleared PT to keep supplemental O2 off).    Home Living                      Prior Function            PT Goals (current goals can now be found in the care plan section) Acute Rehab PT Goals Patient Stated Goal: to have less pain PT Goal Formulation: With patient Time For Goal Achievement: 08/04/16 Potential to Achieve  Goals: Fair Progress towards PT goals: Progressing toward goals    Frequency    BID      PT Plan Current plan remains appropriate    Co-evaluation             End of Session Equipment Utilized During Treatment: Gait belt Activity Tolerance: Patient limited by fatigue Patient left: in chair;with call bell/phone within reach;with chair alarm set;with family/visitor present;with SCD's reapplied (B heels elevated via pillow)     Time: 0950-1030 PT Time Calculation (min) (ACUTE ONLY): 40 min  Charges:  $Therapeutic Activity: 38-52 mins                    G CodesHendricks Limes:      Wendy Meyer 07/22/2016, 10:55 AM Hendricks LimesEmily Jamesen Stahnke, PT [phone]

## 2016-07-22 NOTE — Plan of Care (Signed)
Problem: SLP Dysphagia Goals Goal: Misc Dysphagia Goal Pt will safely tolerate po diet of least restrictive consistency w/ no overt s/s of aspiration noted by Staff/pt/family x3 sessions.    

## 2016-07-22 NOTE — Progress Notes (Signed)
Shift assessment completed at 0800. Pt sleeping soundly at that time, mouth breathing and snoring, o[facility]. Lungs are clear bilat, Hr is regular, abdomen is soft, bs hypoactive. Pt is wearing incontinence brief. Ppp, no edema ntoed. Pt has dressing intact to r lateral hip, dry. PIV #22 intact to R fa, PIV #20 intact to lac,both sites are free of redness and swelling. Pt required a sternal rub to open her eyes, and sustained her attention for a few seconds before returning to sleep. Pt is very HOH. Since initial assessment, pt has received a suppository and had small bm. Pt is currently oob to recliner with two PT assisst, pt was awake for transfer but fell asleep immediately on sitting in recliner. IVF NS hung at 12100mls/hr to PIV in LAC, per order. Dr. Clint GuyHower has rounded on pt. This writer removed PIV from R arm due to pt dislodging this by repeatedly attempting to undress and pull at lines after initially being awakened. Family member at bedside.

## 2016-07-22 NOTE — Progress Notes (Signed)
Pt remains asleep on rounds, ivf continues with site wrapped in kling, free of redness and swelling. Pt continues to require tactile stim to wake up. Call bell in reach.

## 2016-07-22 NOTE — Progress Notes (Signed)
OT Cancellation Note  Patient Details Name: Wendy SitesGeraldine A Skellenger MRN: [id number] DOB: 1930/03/30   Cancelled Treatment:    Reason Eval/Treat Not Completed: Fatigue/lethargy limiting ability to participate (Pt. is requiring assist for self- feeding. Per pt. family, pt. required assist with ADLs at home. Pt. does not get dressed at home, only for MD appointments.  Family in the process of meeting with [facility] as granddaughter-in-law can no longer assist pt.) [facility].  Olegario MessierElaine Khye Hochstetler, MS, OTR/L 07/22/2016, 2:27 PM

## 2016-07-22 NOTE — Progress Notes (Deleted)
[facility] - [place] at [facility]   PATIENT NAME: Wendy Meyer    MR#:  [id number]  DATE OF BIRTH:  1929-09-12  DATE OF ADMISSION:  07/19/2016 ADMITTING PHYSICIAN: Enedina FinnerSona [name], MD  DATE OF DISCHARGE: 07/22/16  PRIMARY CARE PHYSICIAN: Rolm GalaGRANDIS, HEIDI, MD    ADMISSION DIAGNOSIS:  Closed displaced intertrochanteric fracture of right femur, initial encounter (HCC) [S72.141A]  DISCHARGE DIAGNOSIS:  Active Problems:   Hip fracture (HCC) rhabdomyolysis   SECONDARY DIAGNOSIS:   Past Medical History:  Diagnosis Date  . Parkinson disease [facility](HCC)     HOSPITAL COURSE:  Wendy Meyer  is a 82 y.o. female admitted 07/19/2016 with chief complaint Leg Injury and Fall . Please see H&P performed by Enedina FinnerSona [name], MD for further information. Patient presented with the above symptoms, found to have right femur fracture. Evaluated by ortho underwent INTRAMEDULLARY (IM) NAIL INTERTROCHANTRIC RIGHT HIP without complication. She was also noted to have elevated CK on admission - improved with IV fluid hydration.  The family expressed concern over poor appetite, and willing to start patient on remeron  DISCHARGE CONDITIONS:   stable  CONSULTS OBTAINED:  Treatment Team:  Deeann SaintHoward Miller, MD  DRUG ALLERGIES:   Allergies  Allergen Reactions  . Nsaids Other (See Comments)    Gastric upset.     DISCHARGE MEDICATIONS:   Current Discharge Medication List    START taking these medications   Details  acetaminophen (TYLENOL) 325 MG tablet Take 2 tablets (650 mg total) by mouth every 6 (six) hours as needed for mild pain (or Fever >/= 101). Qty: 30 tablet, Refills: 0    bisacodyl (DULCOLAX) 10 MG suppository Place 1 suppository (10 mg total) rectally daily as needed for moderate constipation. Qty: 12 suppository, Refills: 0    enoxaparin (LOVENOX) 40 MG/0.4ML injection Inject 0.4 mLs (40 mg total) into the skin daily. Qty: 5.6 mL, Refills: 0    feeding supplement,  ENSURE ENLIVE, (ENSURE ENLIVE) LIQD Take 237 mLs by mouth 2 (two) times daily between meals. Qty: 237 mL, Refills: 12    ferrous sulfate 325 (65 FE) MG tablet Take 1 tablet (325 mg total) by mouth daily with breakfast. Qty: 30 tablet, Refills: 3    HYDROcodone-acetaminophen (NORCO/VICODIN) 5-325 MG tablet Take 1-2 tablets by mouth every 6 (six) hours as needed for moderate pain. Qty: 30 tablet, Refills: 0    mirtazapine (REMERON) 7.5 MG tablet Take 1 tablet (7.5 mg total) by mouth at bedtime. Qty: 30 tablet, Refills: 0      CONTINUE these medications which have NOT CHANGED   Details  Aloe Vera 500 MG CAPS Take 1 capsule by mouth every evening.    Calcium Carbonate-Vitamin D (CALTRATE 600+D) 600-400 MG-UNIT tablet Take 1 tablet by mouth daily.    carbidopa-levodopa (SINEMET CR) 50-200 MG tablet Take 1 tablet by mouth 2 (two) times daily.    citalopram (CELEXA) 20 MG tablet Take 20 mg by mouth daily.    raloxifene (EVISTA) 60 MG tablet Take 60 mg by mouth daily.         DISCHARGE INSTRUCTIONS:    DIET:  Regular diet  DISCHARGE CONDITION:  Stable  ACTIVITY:  Activity as tolerated  OXYGEN:  Home Oxygen: No.   Oxygen Delivery: room air  DISCHARGE LOCATION:  nursing home   If you experience worsening of your admission symptoms, develop shortness of breath, life threatening emergency, suicidal or homicidal thoughts you must seek medical attention immediately by calling 911 or calling your MD  immediately  if symptoms less severe.  You Must read complete instructions/literature along with all the possible adverse reactions/side effects for all the Medicines you take and that have been prescribed to you. Take any new Medicines after you have completely understood and accpet all the possible adverse reactions/side effects.   Please note  You were cared for by a hospitalist during your hospital stay. If you have any questions about your discharge medications or the care you  received while you were in the hospital after you are discharged, you can call the unit and asked to speak with the hospitalist on call if the hospitalist that took care of you is not available. Once you are discharged, your primary care physician will handle any further medical issues. Please note that NO REFILLS for any discharge medications will be authorized once you are discharged, as it is imperative that you return to your primary care physician (or establish a relationship with a primary care physician if you do not have one) for your aftercare needs so that they can reassess your need for medications and monitor your lab values.    On the day of Discharge:   VITAL SIGNS:  Blood pressure (!) 144/68, pulse (!) 123, temperature 99.1 F (37.3 C), temperature source Oral, resp. rate (!) 32, height 5\' 7"  (1.702 m), weight 67.1 kg (148 lb), SpO2 90 %.  I/O:   Intake/Output Summary (Last 24 hours) at 07/22/16 0910 Last data filed at 07/21/16 1900  Gross per 24 hour  Intake              120 ml  Output                0 ml  Net              120 ml    PHYSICAL EXAMINATION:  GENERAL:  [age] y.o.-year-old patient lying in the bed with no acute distress.  EYES: Pupils equal, round, reactive to light and accommodation. No scleral icterus. Extraocular muscles intact.  HEENT: Head atraumatic, normocephalic. Oropharynx and nasopharynx clear.  NECK:  Supple, no jugular venous distention. No thyroid enlargement, no tenderness.  LUNGS: Normal breath sounds bilaterally, no wheezing, rales,rhonchi or crepitation. No use of accessory muscles of respiration.  CARDIOVASCULAR: S1, S2 normal. No murmurs, rubs, or gallops.  ABDOMEN: Soft, non-tender, non-distended. Bowel sounds present. No organomegaly or mass.  EXTREMITIES: No pedal edema, cyanosis, or clubbing.  NEUROLOGIC: Cranial nerves II through XII are intact. Muscle strength 5/5 in all extremities. Sensation intact. Gait not checked.  PSYCHIATRIC: The  patient is alert and oriented x 1.  SKIN: No obvious rash, lesion, or ulcer.   DATA REVIEW:   CBC  Recent Labs Lab 07/22/16 0436  WBC 9.1  HGB 10.0*  HCT 29.1*  PLT 140*    Chemistries   Recent Labs Lab 07/19/16 1502  07/22/16 0436  NA 140  < > 139  K 4.1  < > 3.3*  CL 103  < > 104  CO2 28  < > 29  GLUCOSE 133*  < > 114*  BUN 30*  < > 20  CREATININE 1.01*  < > 0.79  CALCIUM 8.1*  < > 7.5*  AST 71*  --   --   ALT 20  --   --   ALKPHOS 43  --   --   BILITOT 1.1  --   --   < > = values in this interval not displayed.  Cardiac Enzymes  Recent Labs Lab 07/19/16 1118  TROPONINI 0.03*    Microbiology Results  Results for orders placed or performed during the hospital encounter of 07/19/16  MRSA PCR Screening     Status: None   Collection Time: 07/20/16  9:53 AM  Result Value Ref Range Status   MRSA by PCR NEGATIVE NEGATIVE Final    Comment:        The GeneXpert MRSA Assay (FDA approved for NASAL specimens only), is one component of a comprehensive MRSA colonization surveillance program. It is not intended to diagnose MRSA infection nor to guide or monitor treatment for MRSA infections.     RADIOLOGY:  Dg Hip Operative Unilat W Or W/o Pelvis Right  Result Date: 07/20/2016 CLINICAL DATA:  Post right femur intertrochanteric fracture repair EXAM: OPERATIVE right HIP (WITH PELVIS IF PERFORMED) 3 VIEWS TECHNIQUE: Fluoroscopic spot image(s) were submitted for interpretation post-operatively. COMPARISON:  07/19/2016 FINDINGS: Three views of the right hip submitted. The patient is status post intraoperative repair of proximal right femoral fracture. There is intramedullary rod and metallic locking pin is noted in right femoral neck. There is anatomic alignment. A locking screw is noted in proximal shaft of the right femur. IMPRESSION: Status post intraoperative repair of proximal right femoral fracture. Intramedullary rod and metallic locking pin with anatomic  alignment. Fluoroscopy time was 37 seconds.  Please see the operative report. Electronically Signed   By: Natasha Mead M.D.   On: 07/20/2016 15:38     Management plans discussed with the patient, family and they are in agreement.  CODE STATUS:     Code Status Orders        Start     Ordered   07/20/16 0249  Full code  Continuous     07/20/16 0248    Code Status History    Date Active Date Inactive Code Status Order ID Comments User Context   07/19/2016 12:44 PM 07/20/2016  2:48 AM Full Code [id number]  Deeann Saint, MD ED    Advance Directive Documentation   Flowsheet Row Most Recent Value  Type of Advance Directive  Healthcare Power of Attorney  Pre-existing out of facility DNR order (yellow form or pink MOST form)  No data  "MOST" Form in Place?  No data      TOTAL TIME TAKING CARE OF THIS PATIENT: 33 minutes.    Hower,  Mardi Mainland.D on 07/22/2016 at 9:10 AM  Between 7am to 6pm - Pager - [phone]  After 6pm go to [email or web address] - Social research officer, government  [facility] [place] [facility]  Office  [phone]  CC: Primary care physician; Rolm Gala, MD

## 2016-07-22 NOTE — NC FL2 (Signed)
MEDICAID FL2 LEVEL OF CARE SCREENING TOOL     IDENTIFICATION  Patient Name: Wendy Meyer Birthdate: [birth year] Sex: female Admission Date (Current Location): 07/19/2016  [place]   Facility and Address:  [facility], [address], [place], [place] [address]      Provider Number: [id number]  Attending Physician Name and Address:  Wyatt Hasteavid K Guinn Delarosa, MD  Relative Name and Phone Number:       Current Level of Care: Hospital Recommended Level of Care: Skilled Nursing Facility Prior Approval Number:    Date Approved/Denied:   PASRR Number:  [id number])  Discharge Plan: SNF    Current Diagnoses: Patient Active Problem List   Diagnosis Date Noted  . Hip fracture (HCC) 07/19/2016    Orientation RESPIRATION BLADDER Height & Weight     Self, Time, Place, Situation  Normal Continent Weight: 148 lb (67.1 kg) Height:  5\' 7"  (170.2 cm)  BEHAVIORAL SYMPTOMS/MOOD NEUROLOGICAL BOWEL NUTRITION STATUS   (none)  (none) Continent Diet (NPO for surgery (please see D/C Summary for updated diet) )  AMBULATORY STATUS COMMUNICATION OF NEEDS Skin   Extensive Assist Verbally Surgical wounds                       Personal Care Assistance Level of Assistance  Bathing, Feeding, Dressing Bathing Assistance: Limited assistance Feeding assistance: Independent Dressing Assistance: Limited assistance     Functional Limitations Info  Sight, Hearing, Speech Sight Info: Adequate Hearing Info: Adequate Speech Info: Adequate    SPECIAL CARE FACTORS FREQUENCY  PT (By licensed PT), OT (By licensed OT)     PT Frequency:  (5) OT Frequency:  (5)            Contractures      Additional Factors Info  Code Status, Allergies Code Status Info:  (Full Code. ) Allergies Info:  (Nsaids)           Current Medications (07/22/2016):  This is the current hospital active medication list Current Facility-Administered  Medications  Medication Dose Route Frequency Provider Last Rate Last Dose  . 0.9 %  sodium chloride infusion   Intravenous Continuous Wyatt Hasteavid K Anniebelle Devore, MD      . acetaminophen (TYLENOL) tablet 650 mg  650 mg Oral Q6H PRN Deeann SaintHoward Miller, MD       Or  . acetaminophen (TYLENOL) suppository 650 mg  650 mg Rectal Q6H PRN Deeann SaintHoward Miller, MD      . alum & mag hydroxide-simeth (MAALOX/MYLANTA) 200-200-20 MG/5ML suspension 30 mL  30 mL Oral Q4H PRN Deeann SaintHoward Miller, MD      . bisacodyl (DULCOLAX) suppository 10 mg  10 mg Rectal Daily PRN Deeann SaintHoward Miller, MD   10 mg at 07/22/16 0728  . enoxaparin (LOVENOX) injection 40 mg  40 mg Subcutaneous Q24H Wyatt Hasteavid K Naheem Mosco, MD   40 mg at 07/22/16 [id number]  . feeding supplement (ENSURE ENLIVE) (ENSURE ENLIVE) liquid 237 mL  237 mL Oral BID BM Wyatt Hasteavid K Edin Kon, MD   237 mL at 07/21/16 1656  . ferrous sulfate tablet 325 mg  325 mg Oral Q breakfast Deeann SaintHoward Miller, MD   325 mg at 07/21/16 0818  . HYDROcodone-acetaminophen (NORCO/VICODIN) 5-325 MG per tablet 1-2 tablet  1-2 tablet Oral Q6H PRN Deeann SaintHoward Miller, MD   2 tablet at 07/21/16 0248  . magnesium hydroxide (MILK OF MAGNESIA) suspension 30 mL  30 mL Oral Daily PRN Deeann SaintHoward Miller, MD   30 mL  at 07/21/16 2136  . menthol-cetylpyridinium (CEPACOL) lozenge 3 mg  1 lozenge Oral PRN Deeann SaintHoward Miller, MD       Or  . phenol (CHLORASEPTIC) mouth spray 1 spray  1 spray Mouth/Throat PRN Deeann SaintHoward Miller, MD      . metoCLOPramide (REGLAN) tablet 5-10 mg  5-10 mg Oral Q8H PRN Deeann SaintHoward Miller, MD       Or  . metoCLOPramide (REGLAN) injection 5-10 mg  5-10 mg Intravenous Q8H PRN Deeann SaintHoward Miller, MD      . mirtazapine (REMERON) tablet 7.5 mg  7.5 mg Oral QHS Wyatt Hasteavid K Hadassa Cermak, MD   7.5 mg at 07/21/16 2136  . morphine 2 MG/ML injection 1 mg  1 mg Intravenous Q2H PRN Deeann SaintHoward Miller, MD      . ondansetron [facility](ZOFRAN) tablet 4 mg  4 mg Oral Q6H PRN Deeann SaintHoward Miller, MD       Or  . ondansetron [facility](ZOFRAN) injection 4 mg  4 mg Intravenous Q6H PRN Deeann SaintHoward Miller, MD      . Gwyndolyn Kaufmansenna  [facility](SENOKOT) tablet 8.6 mg  1 tablet Oral BID Deeann SaintHoward Miller, MD   8.6 mg at 07/21/16 2136  . sodium phosphate (FLEET) 7-19 GM/118ML enema 1 enema  1 enema Rectal Once PRN Deeann SaintHoward Miller, MD      . zolpidem Remus Loffler(AMBIEN) tablet 5 mg  5 mg Oral QHS PRN Deeann SaintHoward Miller, MD         Discharge Medications: Please see discharge summary for a list of discharge medications.  Relevant Imaging Results:  Relevant Lab Results:   Additional Information  (SSN: [id number])  Sample, [name], LCSW

## 2016-07-22 NOTE — Progress Notes (Signed)
Subjective: 2 Days Post-Op Procedure(s) (LRB): INTRAMEDULLARY (IM) NAIL INTERTROCHANTRIC (Right)    Patient reports pain as mild.  Objective:   VITALS:   Vitals:   07/22/16 0327 07/22/16 0813  BP: (!) 136/54 (!) 144/68  Pulse: (!) 50 (!) 123  Resp: 19 (!) 32  Temp: 99.7 F (37.6 C) 99.1 F (37.3 C)    Neurologically intact ABD soft Neurovascular intact Sensation intact distally Intact pulses distally Dorsiflexion/Plantar flexion intact Incision: scant drainage  LABS  Recent Labs  07/20/16 0358 07/21/16 0324 07/22/16 0436  HGB 11.9* 9.7* 10.0*  HCT 34.6* 28.2* 29.1*  WBC 10.7 8.2 9.1  PLT 134* 121* 140*     Recent Labs  07/20/16 0358 07/21/16 0324 07/22/16 0436  NA 141 140 139  K 3.8 3.6 3.3*  BUN 31* 27* 20  CREATININE 0.97 0.79 0.79  GLUCOSE 123* 128* 114*     Recent Labs  07/19/16 1502  INR 1.06     Assessment/Plan: 2 Days Post-Op Procedure(s) (LRB): INTRAMEDULLARY (IM) NAIL INTERTROCHANTRIC (Right)   Up with therapy Discharge to SNF when cleared by medicine RTC 2 weeks Discharge on EC ASA BID for 6 weeks

## 2016-07-22 NOTE — Progress Notes (Signed)
Per MD patient is not stable for D/C today and may be ready over the weekend. Plan is for [facility] to [facility] room [facility] when stable. Clinical Child psychotherapistocial Worker (CSW) sent D/C orders to [facility] today. Per [facility] admissions coordinator at [facility] patient can come over the weekend. Patient's granddaughter Judeth CornfieldStephanie is aware of above and will go to [facility] today to complete admissions paper work. Clinical Social Worker (CSW) will continue to follow and assist as needed.   Baker Hughes IncorporatedBailey Ednamae Schiano, LCSW [phone]

## 2016-07-22 NOTE — Progress Notes (Signed)
[facility] -  at [facility]   PATIENT NAME: Wendy JarredGeraldine Robards    MRN#:  [id number]  DATE OF BIRTH:  [birth year]  SUBJECTIVE:  Hospital Day: 3 days Wendy Meyer is a 82 y.o. female presenting with Leg Injury and Fall .   Overnight events: No acute overnight events Interval Events: Confused this morning  REVIEW OF SYSTEMS:  Unable to give reliable information given mental status  DRUG ALLERGIES:   Allergies  Allergen Reactions  . Nsaids Other (See Comments)    Gastric upset.     VITALS:  Blood pressure (!) 144/68, pulse (!) 123, temperature 99.1 F (37.3 C), temperature source Oral, resp. rate (!) 32, height 5\' 7"  (1.702 m), weight 67.1 kg (148 lb), SpO2 90 %.  PHYSICAL EXAMINATION:  VITAL SIGNS: Vitals:   07/22/16 0327 07/22/16 0813  BP: (!) 136/54 (!) 144/68  Pulse: (!) 50 (!) 123  Resp: 19 (!) 32  Temp: 99.7 F (37.6 C) 99.1 F (37.3 C)   GENERAL:82 y.o.female currently in no acute distress.  HEAD: Normocephalic, atraumatic.  EYES: Pupils equal, round, reactive to light. Extraocular muscles intact. No scleral icterus.  MOUTH: Moist mucosal membrane. Dentition intact. No abscess noted.  EAR, NOSE, THROAT: Clear without exudates. No external lesions.  NECK: Supple. No thyromegaly. No nodules. No JVD.  PULMONARY: Clear to ascultation, without wheeze rails or rhonci. No use of accessory muscles, Good respiratory effort. good air entry bilaterally CHEST: Nontender to palpation.  CARDIOVASCULAR: S1 and S2. Regular rate and rhythm. No murmurs, rubs, or gallops. No edema. Pedal pulses 2+ bilaterally.  GASTROINTESTINAL: Soft, nontender, nondistended. No masses. Positive bowel sounds. No hepatosplenomegaly.  MUSCULOSKELETAL: No swelling, clubbing, or edema. Range of motion limited right lower extremity currently immobilized NEUROLOGIC: Unable to fully obtain given patient's mental status PSYCHIATRIC: Unable to fully obtain given  patient's mental status    LABORATORY PANEL:   CBC  Recent Labs Lab 07/22/16 0436  WBC 9.1  HGB 10.0*  HCT 29.1*  PLT 140*   ------------------------------------------------------------------------------------------------------------------  Chemistries   Recent Labs Lab 07/19/16 1502  07/22/16 0436  NA 140  < > 139  K 4.1  < > 3.3*  CL 103  < > 104  CO2 28  < > 29  GLUCOSE 133*  < > 114*  BUN 30*  < > 20  CREATININE 1.01*  < > 0.79  CALCIUM 8.1*  < > 7.5*  AST 71*  --   --   ALT 20  --   --   ALKPHOS 43  --   --   BILITOT 1.1  --   --   < > = values in this interval not displayed. ------------------------------------------------------------------------------------------------------------------  Cardiac Enzymes  Recent Labs Lab 07/19/16 1118  TROPONINI 0.03*   ------------------------------------------------------------------------------------------------------------------  RADIOLOGY:  Dg Hip Operative Unilat W Or W/o Pelvis Right  Result Date: 07/20/2016 CLINICAL DATA:  Post right femur intertrochanteric fracture repair EXAM: OPERATIVE right HIP (WITH PELVIS IF PERFORMED) 3 VIEWS TECHNIQUE: Fluoroscopic spot image(s) were submitted for interpretation post-operatively. COMPARISON:  07/19/2016 FINDINGS: Three views of the right hip submitted. The patient is status post intraoperative repair of proximal right femoral fracture. There is intramedullary rod and metallic locking pin is noted in right femoral neck. There is anatomic alignment. A locking screw is noted in proximal shaft of the right femur. IMPRESSION: Status post intraoperative repair of proximal right femoral fracture. Intramedullary rod and metallic locking pin with anatomic alignment. Fluoroscopy time was 37  seconds.  Please see the operative report. Electronically Signed   By: Natasha MeadLiviu  Pop M.D.   On: 07/20/2016 15:38    EKG:   Orders placed or performed during the hospital encounter of 07/19/16  .  EKG 12-Lead  . EKG 12-Lead    ASSESSMENT AND PLAN:   Wendy JarredGeraldine Mckillop is a 82 y.o. female presenting with Leg Injury and Fall . Admitted 07/19/2016 : Day #: 3 days 1. Right intertrochanteric fracture: POD #1,pain medication, continue bowel regimen 2. Parkinson's continue Sinemet 3. Rhabdomyolysis continue fluids 4. Poor appetite: remeron If patient remains sleepy we will have to discontinue Remeron   All the records are reviewed and case discussed with Care Management/Social Workerr. Management plans discussed with the patient, family and they are in agreement.  CODE STATUS: full TOTAL TIME TAKING CARE OF THIS PATIENT: 28 minutes.   POSSIBLE D/C IN 2-3DAYS, DEPENDING ON CLINICAL CONDITION.   Maliah Pyles,  Mardi MainlandDavid K M.D on 07/22/2016 at 1:55 PM  Between 7am to 6pm - Pager - [phone]  After 6pm: House Pager: - [phone]  [facility]  Office  [phone]  CC: Primary care physician; Rolm GalaGRANDIS, HEIDI, MD

## 2016-07-23 ENCOUNTER — Inpatient Hospital Stay: Payer: Medicare Other

## 2016-07-23 LAB — CBC
HEMATOCRIT: 28.5 % — AB (ref 35.0–47.0)
Hemoglobin: 9.6 g/dL — ABNORMAL LOW (ref 12.0–16.0)
MCH: 32.1 pg (ref 26.0–34.0)
MCHC: 33.7 g/dL (ref 32.0–36.0)
MCV: 95.2 fL (ref 80.0–100.0)
Platelets: 169 10*3/uL (ref 150–440)
RBC: 2.99 MIL/uL — ABNORMAL LOW (ref 3.80–5.20)
RDW: 14 % (ref 11.5–14.5)
WBC: 8.8 10*3/uL (ref 3.6–11.0)

## 2016-07-23 LAB — BASIC METABOLIC PANEL
Anion gap: 9 (ref 5–15)
BUN: 26 mg/dL — ABNORMAL HIGH (ref 6–20)
CALCIUM: 7.2 mg/dL — AB (ref 8.9–10.3)
CO2: 26 mmol/L (ref 22–32)
CREATININE: 0.75 mg/dL (ref 0.44–1.00)
Chloride: 109 mmol/L (ref 101–111)
GFR calc Af Amer: >60 mL/min (ref >60)
GFR calc non Af Amer: >60 mL/min (ref >60)
GLUCOSE: 103 mg/dL — AB (ref 65–99)
Potassium: 3.6 mmol/L (ref 3.5–5.1)
Sodium: 144 mmol/L (ref 135–145)

## 2016-07-23 LAB — CK: Total CK: 383 U/L — ABNORMAL HIGH (ref 38–234)

## 2016-07-23 MED ORDER — ASPIRIN EC 325 MG PO TBEC: 325.0000 mg | DELAYED_RELEASE_TABLET | Freq: Every day | ORAL | 0 refills | Status: DC

## 2016-07-23 NOTE — Progress Notes (Signed)
Physical Therapy Treatment Patient Details Name: Lennice SitesGeraldine A Gaunt MRN: [id number] DOB: [birth year] Today's Date: 07/23/2016    History of Present Illness Pt is an 82 y.o. female s/p fall with R hip pain and imaging showing acute R intertrochanteric fx.  Pt s/p IMN intertrochanteric R hip 07/20/16.  PMH includes Parkinson's.    PT Comments    Pt very lethargic; does not respond to any questioning other than pain, which pt does not quantify, but notes pain in right leg and " ache all over". Pt does not open eyes throughout session; pt does grimace/moan with Right lower extremity movement. Pt participates with supine bed exercises with need to awaken and encourage participation with verbal and tactile cues consistently. Pt demonstrates minimal contraction with isometric exercises and limited active assist with movement exercises. Continue PT to progress participation, strength and endurance to improved up/out of bed function.   Follow Up Recommendations  SNF     Equipment Recommendations  Rolling walker with 5" wheels    Recommendations for Other Services       Precautions / Restrictions Restrictions Weight Bearing Restrictions: Yes RLE Weight Bearing: Partial weight bearing    Mobility  Bed Mobility               General bed mobility comments: not tested due to level of lethargy and responsiveness  Transfers                    Ambulation/Gait                 Stairs            Wheelchair Mobility    Modified Rankin (Stroke Patients Only)       Balance                                    Cognition Arousal/Alertness: Lethargic Behavior During Therapy: Flat affect (does not open eyes) Overall Cognitive Status: Impaired/Different from baseline                 General Comments: Extremely lethargic; consistent need for verbal tactile cueing to stay on task with exercises    Exercises General Exercises - Lower  Extremity Ankle Circles/Pumps: AAROM;Both;20 reps;Supine Quad Sets: Strengthening;Both;10 reps;Supine (minimal contraction especially R) Gluteal Sets: Strengthening;Both;Supine;Other (comment) (6; minimal contractions) Short Arc Quad: AAROM;Right;20 reps;Supine Heel Slides: AAROM;Right;20 reps;Supine Hip ABduction/ADduction: AAROM;Right;20 reps;Supine Straight Leg Raises: PROM;Right;10 reps;Supine Adductor squeeze 10x B   General Comments        Pertinent Vitals/Pain Pain Assessment: Faces (States R hip hurts; cannot quantify) Faces Pain Scale: Hurts even more Pain Location: RLE Pain Intervention(s): Limited activity within patient's tolerance    Home Living                      Prior Function            PT Goals (current goals can now be found in the care plan section) Progress towards PT goals: Not progressing toward goals - comment    Frequency    BID      PT Plan Current plan remains appropriate    Co-evaluation             End of Session Equipment Utilized During Treatment: Oxygen Activity Tolerance: Patient limited by lethargy Patient left: in bed;with call bell/phone within reach;with bed alarm set;with SCD's reapplied  Time: [id number] PT Time Calculation (min) (ACUTE ONLY): 17 min  Charges:  $Therapeutic Exercise: 8-22 mins                    G Codes:      Scot DockHeidi E Barnes, PTA 07/23/2016, 11:01 AM

## 2016-07-23 NOTE — Clinical Social Work Note (Signed)
Patient to dc to [facility] via non-emergent EMS. Family and facility are aware, and packet delivered. CSW will con't to follow for any additional dc needs.  Argentina PonderKaren Martha Kolbie Lepkowski, MSW, LCSW-A [phone]

## 2016-07-23 NOTE — Progress Notes (Signed)
Called Judeth CornfieldStephanie, patient's granddaughter, to let her know patient is 2nd in line with EMS. Nurse told granddaughter that she would call her after EMS came to p/u pt.

## 2016-07-23 NOTE — Progress Notes (Signed)
Subjective:  POD #3  S/p right hip IM fixation.  She is resting comfortably in bed. Patient reports right pain as mild to moderate.  She has no other complaints.  Objective:   VITALS:   Vitals:   07/23/16 0413 07/23/16 0421 07/23/16 0731 07/23/16 1035  BP: 138/61  131/62   Pulse: 100  94 96  Resp: (!) 24     Temp: 99.1 F (37.3 C)  98.7 F (37.1 C)   TempSrc: Axillary  Oral   SpO2: 99% 98% 99% 98%  Weight:      Height:        PHYSICAL EXAM:  Right lower extremity: Patient has a modest amount of serosanguineous drainage on her right hip bandage. There is no significant swelling. Her thigh compartments are soft and compressible. She has no erythema and minimal ecchymosis. Distally patient has on TED stockings and foot pumps.  She has palpable pedal pulses, intact motor function.  She can flex and extend her toes and dorsiflex and plantarflex her ankle. She has intact sensation to light touch which is at her baseline. Her sensation in the right lower extremity is consistent with sensation in the left lower extremity today.   LABS  Results for orders placed or performed during the hospital encounter of 07/19/16 (from the past 24 hour(s))  CBC     Status: Abnormal   Collection Time: 07/23/16  3:59 AM  Result Value Ref Range   WBC 8.8 3.6 - 11.0 K/uL   RBC 2.99 (L) 3.80 - 5.20 MIL/uL   Hemoglobin 9.6 (L) 12.0 - 16.0 g/dL   HCT 40.928.5 (L) 81.135.0 - 91.447.0 %   MCV 95.2 80.0 - 100.0 fL   MCH 32.1 26.0 - 34.0 pg   MCHC 33.7 32.0 - 36.0 g/dL   RDW 78.214.0 95.611.5 - 21.314.5 %   Platelets 169 150 - 440 K/uL  Basic metabolic panel     Status: Abnormal   Collection Time: 07/23/16  3:59 AM  Result Value Ref Range   Sodium 144 135 - 145 mmol/L   Potassium 3.6 3.5 - 5.1 mmol/L   Chloride 109 101 - 111 mmol/L   CO2 26 22 - 32 mmol/L   Glucose, Bld 103 (H) 65 - 99 mg/dL   BUN 26 (H) 6 - 20 mg/dL   Creatinine, Ser 0.860.75 0.44 - 1.00 mg/dL   Calcium 7.2 (L) 8.9 - 10.3 mg/dL   GFR calc non Af Amer >60  >60 mL/min   GFR calc Af Amer >60 >60 mL/min   Anion gap 9 5 - 15  CK     Status: Abnormal   Collection Time: 07/23/16  3:59 AM  Result Value Ref Range   Total CK 383 (H) 38 - 234 U/L    Dg Chest 1 View  Result Date: 07/23/2016 CLINICAL DATA:  Fever, recent ORIF EXAM: CHEST 1 VIEW COMPARISON:  07/19/2016 FINDINGS: Subpleural reticulation/ fibrosis in the bilateral lung bases. Mild left lower lobe opacity, likely atelectasis. No pleural effusion or pneumothorax. Mild cardiomegaly. IMPRESSION: Mild left lower lobe opacity, likely atelectasis. Electronically Signed   By: Charline BillsSriyesh  Krishnan M.D.   On: 07/23/2016 10:44    Assessment/Plan: 3 Days Post-Op   Active Problems:   Hip fracture [facility](HCC)  Patient is stable from an orthopedic standpoint. She may be discharged to rehabilitation when cleared by medicine. Patient will be discharged on enteric-coated aspirin 325 mg by mouth twice a day  6 weeks for DVT prophylaxis.  She will  follow-up with Dr. Hyacinth MeekerMiller in clinic in 2 weeks.  Patient will be partial weightbearing on the right lower extremity until follow-up with Dr. Hyacinth MeekerMiller. Given there is drainage on the patient's reinforce dressing, the patient's nurse will change her dressing before discharge to rehabilitation today.    Juanell FairlyKRASINSKI, Avayah Raffety , MD 07/23/2016, 11:53 AM

## 2016-07-23 NOTE — Discharge Summary (Signed)
[employer] [facility] [employer] at [facility]   PATIENT NAME: Wendy Meyer    MR#:  [id number]  DATE OF BIRTH:  24-Mar-1930  DATE OF ADMISSION:  07/19/2016 ADMITTING PHYSICIAN: [name], MD  DATE OF DISCHARGE: 07/23/2016  PRIMARY CARE PHYSICIAN: Rolm Gala, MD    ADMISSION DIAGNOSIS:  Closed displaced intertrochanteric fracture of right femur, initial encounter (HCC) [S72.141A]  DISCHARGE DIAGNOSIS:  Active Problems:   Hip fracture (HCC)   SECONDARY DIAGNOSIS:   Past Medical History:  Diagnosis Date  . Parkinson disease [facility])     HOSPITAL COURSE:   82 year old female admitted after mechanical fall and found to have a hip fracture.  1. Closed displaced intertrochanteric fracture of right femur: Patient is status post intramedullary nail without any complication. Patient will need aspirin 325 mg daily for 6 weeks. She will follow-up with Dr. Hyacinth Meeker in 2 weeks.  2. Rhabdomyolysis: CK has improved with IV fluids  3. Poor appetite: Patient was initiated on Remeron however due to lethargy this was discontinued  4. Iron deficiency on ferrous sulfate   DISCHARGE CONDITIONS AND DIET:   Stable  Dysphagia 1 diet with nectar thick and aspiration precautions  CONSULTS OBTAINED:  Treatment Team:  Deeann Saint, MD  DRUG ALLERGIES:   Allergies  Allergen Reactions  . Nsaids Other (See Comments)    Gastric upset.     DISCHARGE MEDICATIONS:   Current Discharge Medication List    START taking these medications   Details  acetaminophen (TYLENOL) 325 MG tablet Take 2 tablets (650 mg total) by mouth every 6 (six) hours as needed for mild pain (or Fever >/= 101). Qty: 30 tablet, Refills: 0    aspirin EC 325 MG tablet Take 1 tablet (325 mg total) by mouth daily. Qty: 45 tablet, Refills: 0    bisacodyl (DULCOLAX) 10 MG suppository Place 1 suppository (10 mg total) rectally daily as needed for moderate constipation. Qty: 12 suppository, Refills: 0     feeding supplement, ENSURE ENLIVE, (ENSURE ENLIVE) LIQD Take 237 mLs by mouth 2 (two) times daily between meals. Qty: 237 mL, Refills: 12    ferrous sulfate 325 (65 FE) MG tablet Take 1 tablet (325 mg total) by mouth daily with breakfast. Qty: 30 tablet, Refills: 3    HYDROcodone-acetaminophen (NORCO/VICODIN) 5-325 MG tablet Take 1-2 tablets by mouth every 6 (six) hours as needed for moderate pain. Qty: 30 tablet, Refills: 0      CONTINUE these medications which have NOT CHANGED   Details  Aloe Vera 500 MG CAPS Take 1 capsule by mouth every evening.    Calcium Carbonate-Vitamin D (CALTRATE 600+D) 600-400 MG-UNIT tablet Take 1 tablet by mouth daily.    carbidopa-levodopa (SINEMET CR) 50-200 MG tablet Take 1 tablet by mouth 2 (two) times daily.    citalopram (CELEXA) 20 MG tablet Take 20 mg by mouth daily.    raloxifene (EVISTA) 60 MG tablet Take 60 mg by mouth daily.              Today   CHIEF COMPLAINT:  No acute events overnight patient worked with physical therapy this morning   VITAL SIGNS:  Blood pressure 131/62, pulse 96, temperature 98.7 F (37.1 C), temperature source Oral, resp. rate (!) 24, height 5\' 7"  (1.702 m), weight 67.1 kg (148 lb), SpO2 98 %.   REVIEW OF SYSTEMS:  Review of Systems  Constitutional: Negative.  Negative for chills, fever and malaise/fatigue.  HENT: Negative.  Negative for ear discharge, ear pain,  hearing loss, nosebleeds and sore throat.   Eyes: Negative.  Negative for blurred vision and pain.  Respiratory: Negative.  Negative for cough, hemoptysis, shortness of breath and wheezing.   Cardiovascular: Negative.  Negative for chest pain, palpitations and leg swelling.  Gastrointestinal: Negative.  Negative for abdominal pain, blood in stool, diarrhea, nausea and vomiting.  Genitourinary: Negative.  Negative for dysuria.  Musculoskeletal: Positive for falls and joint pain. Negative for back pain.  Skin: Negative.   Neurological:  Negative for dizziness, tremors, speech change, focal weakness, seizures and headaches.  Endo/Heme/Allergies: Negative.  Does not bruise/bleed easily.  Psychiatric/Behavioral: Negative.  Negative for depression, hallucinations and suicidal ideas.     PHYSICAL EXAMINATION:  GENERAL:  82 y.o.-year-old patient lying in the bed with no acute distress.  NECK:  Supple, no jugular venous distention. No thyroid enlargement, no tenderness.  LUNGS: Normal breath sounds bilaterally, no wheezing, rales,rhonchi  No use of accessory muscles of respiration.  CARDIOVASCULAR: S1, S2 normal. No murmurs, rubs, or gallops.  ABDOMEN: Soft, non-tender, non-distended. Bowel sounds present. No organomegaly or mass.  EXTREMITIES: No pedal edema, cyanosis, or clubbing.  PSYCHIATRIC: The patient is alert and oriented   SKIN: No obvious rash, lesion, or ulcer.   DATA REVIEW:   CBC  Recent Labs Lab 07/23/16 0359  WBC 8.8  HGB 9.6*  HCT 28.5*  PLT 169    Chemistries   Recent Labs Lab 07/19/16 1502  07/23/16 0359  NA 140  < > 144  K 4.1  < > 3.6  CL 103  < > 109  CO2 28  < > 26  GLUCOSE 133*  < > 103*  BUN 30*  < > 26*  CREATININE 1.01*  < > 0.75  CALCIUM 8.1*  < > 7.2*  AST 71*  --   --   ALT 20  --   --   ALKPHOS 43  --   --   BILITOT 1.1  --   --   < > = values in this interval not displayed.  Cardiac Enzymes  Recent Labs Lab 07/19/16 1118  TROPONINI 0.03*    Microbiology Results  @[email or web address] @  RADIOLOGY:  Dg Chest 1 View  Result Date: 07/23/2016 CLINICAL DATA:  Fever, recent ORIF EXAM: CHEST 1 VIEW COMPARISON:  07/19/2016 FINDINGS: Subpleural reticulation/ fibrosis in the bilateral lung bases. Mild left lower lobe opacity, likely atelectasis. No pleural effusion or pneumothorax. Mild cardiomegaly. IMPRESSION: Mild left lower lobe opacity, likely atelectasis. Electronically Signed   By: Charline BillsSriyesh  Krishnan M.D.   On: 07/23/2016 10:44       Stable for discharge snf  Patient  should follow up with ortho  CODE STATUS:     Code Status Orders        Start     Ordered   07/20/16 0249  Full code  Continuous     07/20/16 0248    Code Status History    Date Active Date Inactive Code Status Order ID Comments User Context   07/19/2016 12:44 PM 07/20/2016  2:48 AM Full Code [id number]  Deeann SaintHoward Miller, MD ED    Advance Directive Documentation   Flowsheet Row Most Recent Value  Type of Advance Directive  Healthcare Power of Attorney  Pre-existing out of facility DNR order (yellow form or pink MOST form)  No data  "MOST" Form in Place?  No data      TOTAL TIME TAKING CARE OF THIS PATIENT: 38 minutes.    Note: This dictation  was prepared with Dragon dictation along with smaller phrase technology. Any transcriptional errors that result from this process are unintentional.  Dyonna Jaspers M.D on 07/23/2016 at 12:28 PM  Between 7am to 6pm - Pager - [phone] After 6pm go to [email or web address] - Social research officer, governmentpassword [email or web address]  [facility]  Office  [phone]  CC: Primary care physician; Rolm GalaGRANDIS, HEIDI, MD

## 2016-07-23 NOTE — Progress Notes (Signed)
EMS here to transport. VSS at time of discharge. Called patient's granddaughter to advise.

## 2016-07-23 NOTE — Progress Notes (Signed)
Called report to Tamala SerAngela Walker, RN  at [facility]. Patient going to room 506. IV removed and belongings packed. NT will ready patient for transport with EMS. EMS called.

## 2016-07-23 NOTE — Progress Notes (Signed)
Pt still very sleepy. Pt waking up periodically through the night and asking for fluids to drink. No complaints of pain. Iv infusing without difficulty. incontanant of urine this shift.

## 2016-08-11 ENCOUNTER — Encounter: Payer: Self-pay | Admitting: Emergency Medicine

## 2016-08-11 ENCOUNTER — Emergency Department: Payer: Medicare Other

## 2016-08-11 ENCOUNTER — Inpatient Hospital Stay
Admission: EM | Admit: 2016-08-11 | Discharge: 2016-08-15 | DRG: 871 | Disposition: A | Discharge status: Hospice | Payer: Medicare Other | Attending: Internal Medicine | Admitting: Internal Medicine

## 2016-08-11 DIAGNOSIS — A419 Sepsis, unspecified organism: Secondary | ICD-10-CM | POA: Diagnosis not present

## 2016-08-11 DIAGNOSIS — Z23 Encounter for immunization: Secondary | ICD-10-CM

## 2016-08-11 DIAGNOSIS — M810 Age-related osteoporosis without current pathological fracture: Secondary | ICD-10-CM | POA: Diagnosis present

## 2016-08-11 DIAGNOSIS — S72141D Displaced intertrochanteric fracture of right femur, subsequent encounter for closed fracture with routine healing: Secondary | ICD-10-CM

## 2016-08-11 DIAGNOSIS — N10 Acute tubulo-interstitial nephritis: Secondary | ICD-10-CM | POA: Diagnosis present

## 2016-08-11 DIAGNOSIS — E860 Dehydration: Secondary | ICD-10-CM

## 2016-08-11 DIAGNOSIS — F0390 Unspecified dementia without behavioral disturbance: Secondary | ICD-10-CM | POA: Diagnosis present

## 2016-08-11 DIAGNOSIS — G20 Parkinson's disease: Secondary | ICD-10-CM | POA: Diagnosis present

## 2016-08-11 DIAGNOSIS — Z515 Encounter for palliative care: Secondary | ICD-10-CM | POA: Diagnosis present

## 2016-08-11 DIAGNOSIS — K219 Gastro-esophageal reflux disease without esophagitis: Secondary | ICD-10-CM | POA: Diagnosis present

## 2016-08-11 DIAGNOSIS — G9341 Metabolic encephalopathy: Secondary | ICD-10-CM | POA: Diagnosis present

## 2016-08-11 DIAGNOSIS — R195 Other fecal abnormalities: Secondary | ICD-10-CM | POA: Diagnosis present

## 2016-08-11 DIAGNOSIS — K920 Hematemesis: Secondary | ICD-10-CM | POA: Diagnosis present

## 2016-08-11 DIAGNOSIS — R1111 Vomiting without nausea: Secondary | ICD-10-CM

## 2016-08-11 DIAGNOSIS — E876 Hypokalemia: Secondary | ICD-10-CM | POA: Diagnosis present

## 2016-08-11 DIAGNOSIS — R2681 Unsteadiness on feet: Secondary | ICD-10-CM

## 2016-08-11 DIAGNOSIS — N390 Urinary tract infection, site not specified: Secondary | ICD-10-CM | POA: Diagnosis not present

## 2016-08-11 DIAGNOSIS — E870 Hyperosmolality and hypernatremia: Secondary | ICD-10-CM | POA: Diagnosis present

## 2016-08-11 DIAGNOSIS — Z66 Do not resuscitate: Secondary | ICD-10-CM | POA: Diagnosis present

## 2016-08-11 DIAGNOSIS — F329 Major depressive disorder, single episode, unspecified: Secondary | ICD-10-CM | POA: Diagnosis present

## 2016-08-11 DIAGNOSIS — R112 Nausea with vomiting, unspecified: Secondary | ICD-10-CM

## 2016-08-11 DIAGNOSIS — N289 Disorder of kidney and ureter, unspecified: Secondary | ICD-10-CM

## 2016-08-11 DIAGNOSIS — B965 Pseudomonas (aeruginosa) (mallei) (pseudomallei) as the cause of diseases classified elsewhere: Secondary | ICD-10-CM | POA: Diagnosis present

## 2016-08-11 DIAGNOSIS — M6281 Muscle weakness (generalized): Secondary | ICD-10-CM

## 2016-08-11 LAB — COMPREHENSIVE METABOLIC PANEL
ALBUMIN: 2.2 g/dL — AB (ref 3.5–5.0)
ALT: 9 U/L — AB (ref 14–54)
AST: 17 U/L (ref 15–41)
Alkaline Phosphatase: 117 U/L (ref 38–126)
Anion gap: 14 (ref 5–15)
BILIRUBIN TOTAL: 1.5 mg/dL — AB (ref 0.3–1.2)
BUN: 52 mg/dL — AB (ref 6–20)
CO2: 26 mmol/L (ref 22–32)
CREATININE: 1.37 mg/dL — AB (ref 0.44–1.00)
Calcium: 8.3 mg/dL — ABNORMAL LOW (ref 8.9–10.3)
Chloride: 117 mmol/L — ABNORMAL HIGH (ref 101–111)
GFR calc Af Amer: 40 mL/min — ABNORMAL LOW (ref >60)
GFR calc non Af Amer: 34 mL/min — ABNORMAL LOW (ref >60)
GLUCOSE: 97 mg/dL (ref 65–99)
POTASSIUM: 3.9 mmol/L (ref 3.5–5.1)
Sodium: 157 mmol/L — ABNORMAL HIGH (ref 135–145)
TOTAL PROTEIN: 6.7 g/dL (ref 6.5–8.1)

## 2016-08-11 LAB — TYPE AND SCREEN
ABO/RH(D): O POS
Antibody Screen: NEGATIVE

## 2016-08-11 LAB — TROPONIN I: Troponin I: 0.03 ng/mL (ref <0.03)

## 2016-08-11 LAB — URINALYSIS, COMPLETE (UACMP) WITH MICROSCOPIC
Bilirubin Urine: NEGATIVE
Glucose, UA: NEGATIVE mg/dL
Ketones, ur: 5 mg/dL — AB
NITRITE: NEGATIVE
Protein, ur: 100 mg/dL — AB
SPECIFIC GRAVITY, URINE: 1.018 (ref 1.005–1.030)
SQUAMOUS EPITHELIAL / LPF: NONE SEEN
pH: 5 (ref 5.0–8.0)

## 2016-08-11 LAB — CBC
HEMATOCRIT: 34.8 % — AB (ref 35.0–47.0)
Hemoglobin: 11 g/dL — ABNORMAL LOW (ref 12.0–16.0)
MCH: 30.7 pg (ref 26.0–34.0)
MCHC: 31.5 g/dL — AB (ref 32.0–36.0)
MCV: 97.4 fL (ref 80.0–100.0)
PLATELETS: 244 10*3/uL (ref 150–440)
RBC: 3.57 MIL/uL — ABNORMAL LOW (ref 3.80–5.20)
RDW: 17.5 % — AB (ref 11.5–14.5)
WBC: 6.6 10*3/uL (ref 3.6–11.0)

## 2016-08-11 LAB — HEMOGLOBIN
HEMOGLOBIN: 9.6 g/dL — AB (ref 12.0–16.0)
HEMOGLOBIN: 10.8 g/dL — AB (ref 12.0–16.0)

## 2016-08-11 LAB — LIPASE, BLOOD: Lipase: 15 U/L (ref 11–51)

## 2016-08-11 MED ORDER — PANTOPRAZOLE SODIUM 40 MG IV SOLR
40.0000 mg | Freq: Once | INTRAVENOUS | Status: AC
  Administered 2016-08-11: 40 mg via INTRAVENOUS
  Filled 2016-08-11: qty 40

## 2016-08-11 MED ORDER — DEXTROSE 5 % IV SOLN: 1.0000 g | INTRAVENOUS | Status: DC

## 2016-08-11 MED ORDER — CEFTRIAXONE SODIUM-DEXTROSE 1-3.74 GM-% IV SOLR
1.0000 g | Freq: Once | INTRAVENOUS | Status: AC
  Administered 2016-08-11: 1 g via INTRAVENOUS
  Filled 2016-08-11: qty 50

## 2016-08-11 MED ORDER — INFLUENZA VAC SPLIT QUAD 0.5 ML IM SUSY
0.5000 mL | PREFILLED_SYRINGE | INTRAMUSCULAR | Status: AC
  Administered 2016-08-13: 08:00:00 0.5 mL via INTRAMUSCULAR
  Filled 2016-08-11: qty 0.5

## 2016-08-11 MED ORDER — DEXTROSE 5 % IV SOLN: 1.0000 g | Freq: Once | INTRAVENOUS | Status: DC

## 2016-08-11 MED ORDER — PANTOPRAZOLE SODIUM 40 MG IV SOLR
40.0000 mg | Freq: Two times a day (BID) | INTRAVENOUS | Status: DC
  Administered 2016-08-11 – 2016-08-13 (×4): 40 mg via INTRAVENOUS
  Filled 2016-08-11 (×3): qty 40

## 2016-08-11 MED ORDER — ONDANSETRON HCL 4 MG PO TABS: 4.0000 mg | ORAL_TABLET | Freq: Four times a day (QID) | ORAL | Status: DC | PRN

## 2016-08-11 MED ORDER — SODIUM CHLORIDE 0.9 % IV BOLUS (SEPSIS)
1000.0000 mL | Freq: Once | INTRAVENOUS | Status: AC
  Administered 2016-08-11: 1000 mL via INTRAVENOUS

## 2016-08-11 MED ORDER — CEFTRIAXONE SODIUM-DEXTROSE 1-3.74 GM-% IV SOLR
1.0000 g | INTRAVENOUS | Status: DC
  Administered 2016-08-12 – 2016-08-13 (×2): 1 g via INTRAVENOUS
  Filled 2016-08-11 (×2): qty 50

## 2016-08-11 MED ORDER — CARBIDOPA-LEVODOPA ER 50-200 MG PO TBCR
1.0000 | EXTENDED_RELEASE_TABLET | Freq: Two times a day (BID) | ORAL | Status: DC
  Administered 2016-08-13: 14:00:00 1 via ORAL
  Filled 2016-08-11 (×2): qty 1

## 2016-08-11 MED ORDER — DEXTROSE-NACL 5-0.45 % IV SOLN
INTRAVENOUS | Status: DC
  Administered 2016-08-11 – 2016-08-12 (×3): via INTRAVENOUS

## 2016-08-11 MED ORDER — PNEUMOCOCCAL VAC POLYVALENT 25 MCG/0.5ML IJ INJ
0.5000 mL | INJECTION | INTRAMUSCULAR | Status: AC
  Administered 2016-08-13: 0.5 mL via INTRAMUSCULAR
  Filled 2016-08-11 (×2): qty 0.5

## 2016-08-11 MED ORDER — ONDANSETRON HCL 4 MG/2ML IJ SOLN
4.0000 mg | Freq: Four times a day (QID) | INTRAMUSCULAR | Status: DC | PRN
  Administered 2016-08-13: 10:00:00 4 mg via INTRAVENOUS
  Filled 2016-08-11: qty 2

## 2016-08-11 MED ORDER — SODIUM CHLORIDE 0.9% FLUSH
3.0000 mL | Freq: Two times a day (BID) | INTRAVENOUS | Status: DC
  Administered 2016-08-11 – 2016-08-13 (×3): 3 mL via INTRAVENOUS

## 2016-08-11 NOTE — ED Triage Notes (Signed)
Pt arrived via EMS from [facility] for reports of vomiting once this morning. Staff report color of emesis between dark charcoal to dark brown. Pt at baseline on arrival per EMS. VSS per EMS. SpO2 98% 2L via [place].

## 2016-08-11 NOTE — ED Provider Notes (Signed)
Time Seen: Approximately *1018  I have reviewed the triage notes  Chief Complaint: Emesis   History of Present Illness: Wendy Meyer is a 83 y.o. female who was transported here by EMS from [facility]. Patient apparently had an episode of vomiting 1. She is status post right hip surgery which occurred on December 19. The patient hasn't had any loose stool or diarrhea. Patient herself is really unable to offer any significant history or review of systems. No evidence of fever per the nursing record. The emesis apparently was dark in appearance. No obvious change in mental status Past Medical History:  Diagnosis Date  . Parkinson disease [facility]     Patient Active Problem List   Diagnosis Date Noted  . Hip fracture (HCC) 07/19/2016    Past Surgical History:  Procedure Laterality Date  . INTRAMEDULLARY (IM) NAIL INTERTROCHANTERIC Right 07/20/2016   Procedure: INTRAMEDULLARY (IM) NAIL INTERTROCHANTRIC;  Surgeon: Deeann Saint, MD;  Location: [facility] ORS;  Service: Orthopedics;  Laterality: Right;    Past Surgical History:  Procedure Laterality Date  . INTRAMEDULLARY (IM) NAIL INTERTROCHANTERIC Right 07/20/2016   Procedure: INTRAMEDULLARY (IM) NAIL INTERTROCHANTRIC;  Surgeon: Deeann Saint, MD;  Location: [facility] ORS;  Service: Orthopedics;  Laterality: Right;    Current Outpatient Rx  . Order #: [id number] Class: No Print  . Order #: [id number] Class: Historical Med  . Order #: [id number] Class: Normal  . Order #: [id number] Class: No Print  . Order #: [id number] Class: Historical Med  . Order #: [id number] Class: Historical Med  . Order #: [id number] Class: Historical Med  . Order #: [id number] Class: No Print  . Order #: [id number] Class: No Print  . Order #: [id number] Class: Print  . Order #: [id number] Class: Historical Med    Allergies:  Nsaids  Family History: No family history on file.  Social History: Social History  Substance Use Topics  . Smoking status: Never Smoker   . Smokeless tobacco: Never Used  . Alcohol use No     Review of Systems:   10 point review of systems was performed and was otherwise negative: Review of systems was acquired per EMS and the medical record. Constitutional: No fever Eyes: No visual disturbances ENT: No sore throat, ear pain Cardiac: No chest pain Respiratory: No shortness of breath, wheezing, or stridor Abdomen: No abdominal pain. No bowel complaints. Vomiting was times one. Endocrine: No weight loss, No night sweats Extremities: No peripheral edema, cyanosis Skin: No rashes, easy bruising Neurologic: No focal weakness, trouble with speech or swollowing Urologic: No dysuria, Hematuria, or urinary frequency   Physical Exam:  ED Triage Vitals [08/11/16 1009]  Enc Vitals Group     BP (!) 109/54     Pulse      Resp 18     Temp 97.9 F (36.6 C)     Temp Source Axillary     SpO2      Weight 148 lb (67.1 kg)     Height 5\' 7"  (1.702 m)     Head Circumference      Peak Flow      Pain Score      Pain Loc      Pain Edu?      Excl. in GC?     General: Awake , Alert , and Oriented times1. Somewhat lethargic Head: Normal cephalic , atraumatic Eyes: Pupils equal , round, reactive to light Nose/Throat: Dry mucous membranes  Neck: Supple, Full range of motion, No anterior adenopathy or palpable thyroid masses Lungs: Clear to ascultation without  wheezes , rhonchi, or rales Heart: Regular rate, regular rhythm without murmurs , gallops , or rubs Abdomen: Soft, non tender without rebound, guarding , or rigidity mild distention, bowel sounds positive and symmetric in all 4 quadrants. No organomegaly .   No palpable masses     Extremities: Postoperative wound right hip appears to be well healing well aligned Neurologic: normal ambulation, Motor symmetric without deficits, sensory intact Skin: warm, dry, no rashes Rectal exam with chaperone present shows hard stool in the rectal vault, dark in appearance and is guaiac  positive. Good rectal tone without any palpable masses in the rectal vault  Labs:   All laboratory work was reviewed including any pertinent negatives or positives listed below:  Labs Reviewed  COMPREHENSIVE METABOLIC PANEL - Abnormal; Notable for the following:       Result Value   Sodium 157 (*)    Chloride 117 (*)    BUN 52 (*)    Creatinine, Ser 1.37 (*)    Calcium 8.3 (*)    Albumin 2.2 (*)    ALT 9 (*)    Total Bilirubin 1.5 (*)    GFR calc non Af Amer 34 (*)    GFR calc Af Amer 40 (*)    All other components within normal limits  CBC - Abnormal; Notable for the following:    RBC 3.57 (*)    Hemoglobin 11.0 (*)    HCT 34.8 (*)    MCHC 31.5 (*)    RDW 17.5 (*)    All other components within normal limits  URINALYSIS, COMPLETE (UACMP) WITH MICROSCOPIC - Abnormal; Notable for the following:    Color, Urine YELLOW (*)    APPearance TURBID (*)    Hgb urine dipstick SMALL (*)    Ketones, ur 5 (*)    Protein, ur 100 (*)    Leukocytes, UA MODERATE (*)    Bacteria, UA MANY (*)    All other components within normal limits  URINE CULTURE  LIPASE, BLOOD  TYPE AND SCREEN  Review laboratory work shows a positive urinalysis along with high sodium and some renal insufficiency  EKG: * ED ECG REPORT I, Jennye Moccasin, the attending physician, personally viewed and interpreted this ECG.  Date: 08/11/2016 EKG Time: 1009Rate: *92 Rhythm: normal sinus rhythm QRS Axis: normal Intervals: normal ST/T Wave abnormalities: normal Conduction Disturbances: none Narrative Interpretation: unremarkable No acute ischemic changes   Radiology:  "Dg Chest 1 View  Result Date: 07/23/2016 CLINICAL DATA:  Fever, recent ORIF EXAM: CHEST 1 VIEW COMPARISON:  07/19/2016 FINDINGS: Subpleural reticulation/ fibrosis in the bilateral lung bases. Mild left lower lobe opacity, likely atelectasis. No pleural effusion or pneumothorax. Mild cardiomegaly. IMPRESSION: Mild left lower lobe opacity, likely  atelectasis. Electronically Signed   By: Charline Bills M.D.   On: 07/23/2016 10:44   Dg Chest 1 View  Result Date: 07/19/2016 CLINICAL DATA:  Fall. EXAM: CHEST 1 VIEW COMPARISON:  05/25/2009 . FINDINGS: Mediastinum and hilar structures are normal. Basilar pulmonary interstitial prominence noted consistent with chronic interstitial lung disease. No change from prior exam . Heart size stable. No pulmonary venous congestion . IMPRESSION: Chronic interstitial lung disease. Electronically Signed   By: Maisie Fus  Register   On: 07/19/2016 12:07   Ct Head Wo Contrast  Result Date: 07/19/2016 CLINICAL DATA:  Confusion, weakness, found on the floor, neck pain EXAM: CT HEAD WITHOUT CONTRAST CT CERVICAL SPINE WITHOUT CONTRAST TECHNIQUE: Multidetector CT imaging of the head and cervical spine was performed  following the standard protocol without intravenous contrast. Multiplanar CT image reconstructions of the cervical spine were also generated. COMPARISON:  10/30/ 10 FINDINGS: CT HEAD FINDINGS Brain: No intracranial hemorrhage, mass effect or midline shift. Moderate cerebral atrophy. Extensive periventricular and patchy subcortical white matter decreased attenuation probable due to small vessel ischemic changes. No acute cortical infarction. No mass lesion is noted on this unenhanced scan. Vascular: Atherosclerotic calcifications of carotid siphon. Atherosclerotic calcifications of vertebral arteries. Skull: No skull fracture is noted. Metallic dental artifact are noted. Sinuses/Orbits: No acute findings. There is previous right mastoidectomy Other: None CT CERVICAL SPINE FINDINGS Alignment: Normal alignment of the cervical spine Skull base and vertebrae: No acute fracture or subluxation. Degenerative changes are noted C1-C2 articulation. Mild anterior spurring at C5-C6 level. Soft tissues and spinal canal: Spinal canal is patent. No spinal canal hematoma. No prevertebral soft tissue swelling. Disc levels:  Minimal  disc space flattening at C5-C6 level. Upper chest: There is no pneumothorax in visualized lung apices. Other: Atherosclerotic calcifications are noted bilateral carotid bifurcation. IMPRESSION: 1. No acute intracranial abnormality. Moderate cerebral atrophy. No definite acute cortical infarction. Significant periventricular and patchy subcortical white matter decreased attenuation probable due to chronic small vessel ischemic changes. 2. No cervical spine acute fracture or subluxation. Degenerative changes as described above. Electronically Signed   By: Natasha Mead M.D.   On: 07/19/2016 12:02   Ct Cervical Spine Wo Contrast  Result Date: 07/19/2016 CLINICAL DATA:  Confusion, weakness, found on the floor, neck pain EXAM: CT HEAD WITHOUT CONTRAST CT CERVICAL SPINE WITHOUT CONTRAST TECHNIQUE: Multidetector CT imaging of the head and cervical spine was performed following the standard protocol without intravenous contrast. Multiplanar CT image reconstructions of the cervical spine were also generated. COMPARISON:  10/30/ 10 FINDINGS: CT HEAD FINDINGS Brain: No intracranial hemorrhage, mass effect or midline shift. Moderate cerebral atrophy. Extensive periventricular and patchy subcortical white matter decreased attenuation probable due to small vessel ischemic changes. No acute cortical infarction. No mass lesion is noted on this unenhanced scan. Vascular: Atherosclerotic calcifications of carotid siphon. Atherosclerotic calcifications of vertebral arteries. Skull: No skull fracture is noted. Metallic dental artifact are noted. Sinuses/Orbits: No acute findings. There is previous right mastoidectomy Other: None CT CERVICAL SPINE FINDINGS Alignment: Normal alignment of the cervical spine Skull base and vertebrae: No acute fracture or subluxation. Degenerative changes are noted C1-C2 articulation. Mild anterior spurring at C5-C6 level. Soft tissues and spinal canal: Spinal canal is patent. No spinal canal hematoma. No  prevertebral soft tissue swelling. Disc levels:  Minimal disc space flattening at C5-C6 level. Upper chest: There is no pneumothorax in visualized lung apices. Other: Atherosclerotic calcifications are noted bilateral carotid bifurcation. IMPRESSION: 1. No acute intracranial abnormality. Moderate cerebral atrophy. No definite acute cortical infarction. Significant periventricular and patchy subcortical white matter decreased attenuation probable due to chronic small vessel ischemic changes. 2. No cervical spine acute fracture or subluxation. Degenerative changes as described above. Electronically Signed   By: Natasha Mead M.D.   On: 07/19/2016 12:02   Dg Abd Acute W/chest  Result Date: 08/11/2016 CLINICAL DATA:  Recent vomiting EXAM: DG ABDOMEN ACUTE W/ 1V CHEST COMPARISON:  07/23/2016 FINDINGS: Cardiac shadow is mildly enlarged but stable. Aortic calcifications are again seen. Mild basilar scarring is again seen and stable from the prior exam. Scattered large and small bowel gas is noted. Mild retained fecal material is seen. Postoperative changes in the proximal right femur are noted. No acute bony abnormality is seen. IMPRESSION: Mild retained  fecal material within the colon. No new focal abnormality is seen. Electronically Signed   By: Alcide CleverMark  Lukens M.D.   On: 08/11/2016 11:50   Dg Hip Operative Unilat W Or W/o Pelvis Right  Result Date: 07/20/2016 CLINICAL DATA:  Post right femur intertrochanteric fracture repair EXAM: OPERATIVE right HIP (WITH PELVIS IF PERFORMED) 3 VIEWS TECHNIQUE: Fluoroscopic spot image(s) were submitted for interpretation post-operatively. COMPARISON:  07/19/2016 FINDINGS: Three views of the right hip submitted. The patient is status post intraoperative repair of proximal right femoral fracture. There is intramedullary rod and metallic locking pin is noted in right femoral neck. There is anatomic alignment. A locking screw is noted in proximal shaft of the right femur. IMPRESSION:  Status post intraoperative repair of proximal right femoral fracture. Intramedullary rod and metallic locking pin with anatomic alignment. Fluoroscopy time was 37 seconds.  Please see the operative report. Electronically Signed   By: Natasha MeadLiviu  Pop M.D.   On: 07/20/2016 15:38   Dg Hip Unilat W Or Wo Pelvis 2-3 Views Right  Result Date: 07/19/2016 CLINICAL DATA:  Injury. EXAM: DG HIP (WITH OR WITHOUT PELVIS) 2-3V RIGHT COMPARISON:  No recent prior. FINDINGS: Angulated comminuted right intertrochanteric hip fracture noted. Diffuse osteopenia degenerative change. Aortoiliac atherosclerotic vascular calcification. Prominent amount of stool noted throughout the colon. IMPRESSION: Angulated comminuted intertrochanteric hip fracture on the right noted. Electronically Signed   By: Maisie Fushomas  Register   On: 07/19/2016 12:09  "  I personally reviewed the radiologic studies  Previous x-ray results were reviewed. Three-way of the abdomen shows no signs of obstruction or free air  ED Course: * Patient was started on IV fluid resuscitation for what appears to be dehydration. The patient also was started on IV antibiotics for urinary tract infection on a cath urinalysis. Her rectal exam is guaiac positive with some dark appearing stool in the rectal vault with possibility of an upper gastrointestinal bleed. Clinical Course      Assessment:  Dehydration Possible upper gastrointestinal bleed Urinary tract infection      Plan: * Inpatient            Jennye MoccasinBrian S Esmerelda Finnigan, MD 08/11/16 1225

## 2016-08-11 NOTE — Progress Notes (Signed)
        Advance care planning    Present: DR Sherrlyn HockVaickute, POA for medical care . Mrs. Wendy Meyer   Diagnoses: Sepsis Acute pyelonephritis Renal insufficiency Metabolic encephalopathy Hypernatremia Coffee-ground emesis/gastrointestinal bleed/anemia    I discussed patient's case with power of attorney for medical care. Mrs. Wendy Meyer, explained to her that patient's dehydration as well as infection poses risks to her survival, we discussed CODE STATUS, as she wanted to continue close status. the patient designated in the recent past. Blood transfusion, possibility was also discussed all the risks as well as benefits were discussed, all questions were answered, she was understanding.   Time spent  16 minutes

## 2016-08-11 NOTE — ED Notes (Signed)
Pt placed in slight trendelenburg position to help maintain blood pressure.

## 2016-08-11 NOTE — H&P (Addendum)
[facility] - [place] at [facility]   PATIENT NAME: Wendy Meyer    MR#:  [id number]  DATE OF BIRTH:  1930/02/14  DATE OF ADMISSION:  08/11/2016  PRIMARY CARE PHYSICIAN: Rolm Gala, MD   REQUESTING/REFERRING PHYSICIAN:   CHIEF COMPLAINT:   Chief Complaint  Patient presents with  . Emesis    HISTORY OF PRESENT ILLNESS: Wendy Meyer  is a 83 y.o. female with a known history of Parkinson's disease, recent admission in December 2017 for right intertrochanteric fracture due to fall, who presents to the hospital with complaints of vomiting, charcoal-like substance. On the patient's granddaughter in law, who is power of attorney for medical care, patient has been having problems with abdominal pain intermittently. She complained of upset stomach for long, long while, not being able to eat well. Now since her discharge from the hospital for hip fracture to skilled nursing facility, [facility], patient is not able to consume much of the food, number may be 15% or less. She is also not progressing with physical therapy. Today, however, she had an episode of vomiting with charcoal-like substance, per patient's power of attorney for medical care, and she was brought to emergency room for further evaluation and treatment. Patient herself is obtunded, prostrated, not able to provide much of history. She complains of some mild epigastric abdominal pain on examination. Her Hemoccult of stool was positive. Hospitalist services were contacted for admission. Labs revealed severe hypernatremia, renal insufficiency, urinary tract infection, patient complains of some flank pain, although it is unclear if these are chronic pains.  PAST MEDICAL HISTORY:   Past Medical History:  Diagnosis Date  . Parkinson disease (HCC)     PAST SURGICAL HISTORY: Past Surgical History:  Procedure Laterality Date  . INTRAMEDULLARY (IM) NAIL INTERTROCHANTERIC Right 07/20/2016   Procedure: INTRAMEDULLARY (IM) NAIL INTERTROCHANTRIC;  Surgeon: Deeann Saint, MD;  Location: [facility] ORS;  Service: Orthopedics;  Laterality: Right;    SOCIAL HISTORY:  Social History  Substance Use Topics  . Smoking status: Never Smoker  . Smokeless tobacco: Never Used  . Alcohol use No    FAMILY HISTORY: No family history on file.  DRUG ALLERGIES:  Allergies  Allergen Reactions  . Nsaids Other (See Comments)    Gastric upset.     Review of Systems  Unable to perform ROS: Dementia  Gastrointestinal: Positive for abdominal pain and vomiting.  Genitourinary: Positive for flank pain.    MEDICATIONS AT HOME:  Prior to Admission medications   Medication Sig Start Date End Date Taking? Authorizing Provider  acetaminophen (TYLENOL) 325 MG tablet Take 2 tablets (650 mg total) by mouth every 6 (six) hours as needed for mild pain (or Fever >/= 101). 07/22/16   Wyatt Haste, MD  Aloe Vera 500 MG CAPS Take 1 capsule by mouth every evening.    Historical Provider, MD  aspirin EC 325 MG tablet Take 1 tablet (325 mg total) by mouth daily. 07/23/16 09/06/16  Adrian Saran, MD  bisacodyl (DULCOLAX) 10 MG suppository Place 1 suppository (10 mg total) rectally daily as needed for moderate constipation. 07/22/16   Wyatt Haste, MD  Calcium Carbonate-Vitamin D (CALTRATE 600+D) 600-400 MG-UNIT tablet Take 1 tablet by mouth daily.    Historical Provider, MD  carbidopa-levodopa (SINEMET CR) 50-200 MG tablet Take 1 tablet by mouth 2 (two) times daily.    Historical Provider, MD  citalopram (CELEXA) 20 MG tablet Take 20 mg by mouth daily.    Historical Provider, MD  feeding supplement, ENSURE ENLIVE, (ENSURE ENLIVE) LIQD Take 237 mLs by mouth 2 (two) times daily between meals. 07/22/16   Wyatt Haste, MD  ferrous sulfate 325 (65 FE) MG tablet Take 1 tablet (325 mg total) by mouth daily with breakfast. 07/22/16   Wyatt Haste, MD  HYDROcodone-acetaminophen (NORCO/VICODIN) 5-325 MG tablet Take 1-2 tablets by  mouth every 6 (six) hours as needed for moderate pain. 07/22/16   Wyatt Haste, MD  raloxifene (EVISTA) 60 MG tablet Take 60 mg by mouth daily.    Historical Provider, MD      PHYSICAL EXAMINATION:   VITAL SIGNS: Blood pressure (!) 109/54, temperature 97.9 F (36.6 C), temperature source Axillary, resp. rate 18, height 5\' 7"  (1.702 m), weight 67.1 kg (148 lb).  GENERAL:  83 y.o.-year-old patient lying in the bed in moderate distress, very somnolent, prostrated laying on the bed, open-mouthed, snoring intermittently, briefly opens her eyes and able to answer a few questions, intermittently follows commands. Severe oral mucosal dryness, poor dentition EYES: Pupils equal, round, reactive to light and accommodation. No scleral icterus. Extraocular muscles intact.  HEENT: Head atraumatic, normocephalic. Oropharynx and nasopharynx clear. Dry oral mucosa, lips, poor dentition NECK:  Supple, no jugular venous distention. No thyroid enlargement, no tenderness.  LUNGS: Normal breath sounds bilaterally, no wheezing, rales,rhonchi or crepitation. No use of accessory muscles of respiration.  CARDIOVASCULAR: S1, S2 normal. No murmurs, rubs, or gallops.  ABDOMEN: Soft, mild discomfort in epigastric area, no rebound or guarding,, nondistended. Bowel sounds present. No organomegaly or mass. Hemoccult positive, per emergency room physician. Suture site on the right hip is healing, no purulence, dehiscence or drainage, no swelling, tenderness on palpation EXTREMITIES: No pedal edema, cyanosis, or clubbing.  NEUROLOGIC: Cranial nerves II through XII are grossly intact. Muscle strength is difficult evaluated due to patient's status. Sensation grossly intact. Gait not checked.  PSYCHIATRIC: The patient is very somnolent, unable to assess orientation, able to open her eyes, briefly answer yes or no, then drifts back to sleep, intermittently follows commands  SKIN: No obvious rash, lesion, or ulcer.   LABORATORY  PANEL:   CBC  Recent Labs Lab 08/11/16 1011  WBC 6.6  HGB 11.0*  HCT 34.8*  PLT 244  MCV 97.4  MCH 30.7  MCHC 31.5*  RDW 17.5*   ------------------------------------------------------------------------------------------------------------------  Chemistries   Recent Labs Lab 08/11/16 1011  NA 157*  K 3.9  CL 117*  CO2 26  GLUCOSE 97  BUN 52*  CREATININE 1.37*  CALCIUM 8.3*  AST 17  ALT 9*  ALKPHOS 117  BILITOT 1.5*   ------------------------------------------------------------------------------------------------------------------  Cardiac Enzymes No results for input(s): TROPONINI in the last 168 hours. ------------------------------------------------------------------------------------------------------------------  RADIOLOGY: Dg Abd Acute W/chest  Result Date: 08/11/2016 CLINICAL DATA:  Recent vomiting EXAM: DG ABDOMEN ACUTE W/ 1V CHEST COMPARISON:  07/23/2016 FINDINGS: Cardiac shadow is mildly enlarged but stable. Aortic calcifications are again seen. Mild basilar scarring is again seen and stable from the prior exam. Scattered large and small bowel gas is noted. Mild retained fecal material is seen. Postoperative changes in the proximal right femur are noted. No acute bony abnormality is seen. IMPRESSION: Mild retained fecal material within the colon. No new focal abnormality is seen. Electronically Signed   By: Alcide Clever M.D.   On: 08/11/2016 11:50    EKG: Orders placed or performed during the hospital encounter of 08/11/16  . EKG 12-Lead  . EKG 12-Lead   EKG in emergency room revealed sinus rhythm  at 92 bpm, normal axis, no acute ST-T changes IMPRESSION AND PLAN:  Principal Problem:   Sepsis (HCC) Active Problems:   Acute pyelonephritis   Acute renal insufficiency   Dehydration   Hypernatremia   Guaiac positive stools   Nausea & vomiting #1. Sepsis due to acute pyelonephritis/urinary tract infection, admit patient to medical floor, initiate her  on the Rocephin, get cultures, continue IV fluids #2. Acute renal insufficiency, due to hydration issues, questionable pyelonephritis, continue IV fluids, follow kidney function in the morning #3. Hypernatremia, change IV fluids to D5 water solution. After volume depletion #4. Coffee-ground emesis, initiate patient on Protonix intravenously, get gastroenteritis involved, get hemoglobin level checked every 8 hours, transfuse as needed, discussed with power of attorney for medical care, she is agreeable for transfusion, all risks as well as benefits were discussed #5. Metabolic encephalopathy, supportive therapy, aspiration precautions   All the records are reviewed and case discussed with ED provider. Management plans discussed with the patient, family and they are in agreement.  CODE STATUS: Code Status History    Date Active Date Inactive Code Status Order ID Comments User Context   07/20/2016  2:48 AM 07/23/2016  7:08 PM Full Code [id number]  Enedina FinnerSona [name], MD Inpatient   07/19/2016 12:44 PM 07/20/2016  2:48 AM Full Code [id number]  Deeann SaintHoward Miller, MD ED       TOTAL Critical care TIME TAKING CARE OF THIS PATIENT: 55 minutes.    Katharina CaperVAICKUTE,Deztiny Sarra M.D on 08/11/2016 at 12:48 PM  Between 7am to 6pm - Pager - [phone] After 6pm go to [email or web address] - password [email or web address] [facility]  [place] [facility] [place] [facility]  Office  [phone]  CC: Primary care physician; Rolm GalaGRANDIS, HEIDI, MD

## 2016-08-12 LAB — CBC
HCT: 29.4 % — ABNORMAL LOW (ref 35.0–47.0)
HEMOGLOBIN: 9.6 g/dL — AB (ref 12.0–16.0)
MCH: 31 pg (ref 26.0–34.0)
MCHC: 32.5 g/dL (ref 32.0–36.0)
MCV: 95.4 fL (ref 80.0–100.0)
PLATELETS: 219 10*3/uL (ref 150–440)
RBC: 3.08 MIL/uL — AB (ref 3.80–5.20)
RDW: 17.2 % — ABNORMAL HIGH (ref 11.5–14.5)
WBC: 5.9 10*3/uL (ref 3.6–11.0)

## 2016-08-12 LAB — BASIC METABOLIC PANEL
ANION GAP: 4 — AB (ref 5–15)
Anion gap: 6 (ref 5–15)
BUN: 40 mg/dL — ABNORMAL HIGH (ref 6–20)
BUN: 28 mg/dL — ABNORMAL HIGH (ref 6–20)
CHLORIDE: 119 mmol/L — AB (ref 101–111)
CHLORIDE: 116 mmol/L — AB (ref 101–111)
CO2: 31 mmol/L (ref 22–32)
CO2: 31 mmol/L (ref 22–32)
CREATININE: 1.08 mg/dL — AB (ref 0.44–1.00)
Calcium: 7.8 mg/dL — ABNORMAL LOW (ref 8.9–10.3)
Calcium: 7.8 mg/dL — ABNORMAL LOW (ref 8.9–10.3)
Creatinine, Ser: 0.8 mg/dL (ref 0.44–1.00)
GFR calc Af Amer: >60 mL/min (ref >60)
GFR calc non Af Amer: 46 mL/min — ABNORMAL LOW (ref >60)
GFR calc non Af Amer: >60 mL/min (ref >60)
GFR, EST AFRICAN AMERICAN: 53 mL/min — AB (ref >60)
GLUCOSE: 119 mg/dL — AB (ref 65–99)
Glucose, Bld: 186 mg/dL — ABNORMAL HIGH (ref 65–99)
POTASSIUM: 3.8 mmol/L (ref 3.5–5.1)
Potassium: 3.3 mmol/L — ABNORMAL LOW (ref 3.5–5.1)
Sodium: 156 mmol/L — ABNORMAL HIGH (ref 135–145)
Sodium: 151 mmol/L — ABNORMAL HIGH (ref 135–145)

## 2016-08-12 LAB — TROPONIN I
Troponin I: <0.03 ng/mL (ref <0.03)
Troponin I: <0.03 ng/mL (ref <0.03)

## 2016-08-12 LAB — GLUCOSE, CAPILLARY: Glucose-Capillary: 166 mg/dL — ABNORMAL HIGH (ref 65–99)

## 2016-08-12 MED ORDER — DEXTROSE 5 % IV SOLN
INTRAVENOUS | Status: AC
  Administered 2016-08-12 – 2016-08-13 (×2): via INTRAVENOUS

## 2016-08-12 MED ORDER — HEPARIN SODIUM (PORCINE) 5000 UNIT/ML IJ SOLN
5000.0000 [IU] | Freq: Three times a day (TID) | INTRAMUSCULAR | Status: DC
  Administered 2016-08-12 – 2016-08-13 (×4): 5000 [IU] via SUBCUTANEOUS
  Filled 2016-08-12 (×4): qty 1

## 2016-08-12 MED ORDER — SODIUM CHLORIDE 0.9 % IV SOLN
30.0000 meq | Freq: Once | INTRAVENOUS | Status: AC
  Administered 2016-08-12: 17:00:00 30 meq via INTRAVENOUS
  Filled 2016-08-12: qty 15

## 2016-08-12 NOTE — Progress Notes (Signed)
Family at bedside this morning. Patient is from [facility] for rehab post right hip surgery.   Granddaughter-in-law/co-POA Judeth CornfieldStephanie [phone]) is first point of contact. Her and granddaughter, Joice Loftsmber, are expressing they do not want Ms. Cotterman to go back to [facility], would like her to go to [facility] if possible. Social Work Consult placed. Case Manager made aware.

## 2016-08-12 NOTE — [place] FL2 (Signed)
[place] MEDICAID FL2 LEVEL OF CARE SCREENING TOOL     IDENTIFICATION  Patient Name: Wendy Meyer Birthdate: 09/25/1929 Sex: female Admission Date (Current Location): 08/11/2016  [place]   Facility and Address:  [facility], [address], [place], [place] [address]      Provider Number: [id number]  Attending Physician Name and Address:  Enid Baasadhika Kalisetti, MD  Relative Name and Phone Number:       Current Level of Care: Hospital Recommended Level of Care: Skilled Nursing Facility Prior Approval Number:    Date Approved/Denied:   PASRR Number:  [id number])  Discharge Plan: SNF    Current Diagnoses: Patient Active Problem List   Diagnosis Date Noted  . Sepsis (HCC) 08/11/2016  . Acute pyelonephritis 08/11/2016  . Acute renal insufficiency 08/11/2016  . Dehydration 08/11/2016  . Hypernatremia 08/11/2016  . Guaiac positive stools 08/11/2016  . Nausea & vomiting 08/11/2016  . Hip fracture (HCC) 07/19/2016    Orientation RESPIRATION BLADDER Height & Weight     Self  O2 (2 Liters Oxygen ) Incontinent Weight: 138 lb 14.2 oz (63 kg) Height:  5\' 7"  (170.2 cm)  BEHAVIORAL SYMPTOMS/MOOD NEUROLOGICAL BOWEL NUTRITION STATUS   (none)  (none ) Continent Diet (SLP to eval )  AMBULATORY STATUS COMMUNICATION OF NEEDS Skin   Extensive Assist Verbally Normal                       Personal Care Assistance Level of Assistance  Bathing, Feeding, Dressing Bathing Assistance: Limited assistance Feeding assistance: Independent Dressing Assistance: Limited assistance     Functional Limitations Info  Sight, Hearing, Speech Sight Info: Adequate Hearing Info: Adequate Speech Info: Adequate    SPECIAL CARE FACTORS FREQUENCY  PT (By licensed PT), OT (By licensed OT)     PT Frequency:  (5) OT Frequency:  (5)            Contractures      Additional Factors Info  Code Status, Allergies Code Status  Info:  (Full code. ) Allergies Info:  (Nsaids)           Current Medications (08/12/2016):  This is the current hospital active medication list Current Facility-Administered Medications  Medication Dose Route Frequency Provider Last Rate Last Dose  . carbidopa-levodopa (SINEMET CR) 50-200 MG per tablet controlled release 1 tablet  1 tablet Oral BID Katharina Caperima Vaickute, MD      . cefTRIAXone (ROCEPHIN) IVPB 1 g  1 g Intravenous Q24H Katharina Caperima Vaickute, MD   1 g at 08/12/16 0919  . dextrose 5 % solution   Intravenous Continuous Enid Baasadhika Kalisetti, MD      . Influenza vac split quadrivalent PF (FLUARIX) injection 0.5 mL  0.5 mL Intramuscular Tomorrow-1000 Katharina Caperima Vaickute, MD      . ondansetron (ZOFRAN) tablet 4 mg  4 mg Oral Q6H PRN Katharina Caperima Vaickute, MD       Or  . ondansetron (ZOFRAN) injection 4 mg  4 mg Intravenous Q6H PRN Katharina Caperima Vaickute, MD      . pantoprazole (PROTONIX) injection 40 mg  40 mg Intravenous Q12H Katharina Caperima Vaickute, MD   40 mg at 08/12/16 0800  . pneumococcal 23 valent vaccine (PNU-IMMUNE) injection 0.5 mL  0.5 mL Intramuscular Tomorrow-1000 Katharina Caperima Vaickute, MD      . sodium chloride flush (NS) 0.9 % injection 3 mL  3 mL Intravenous Q12H Katharina Caperima Vaickute, MD   3 mL at 08/12/16 0800  Discharge Medications: Please see discharge summary for a list of discharge medications.  Relevant Imaging Results:  Relevant Lab Results:   Additional Information  (SSN: [id number])  Dierre Crevier, [name], LCSW

## 2016-08-12 NOTE — Progress Notes (Addendum)
[facility] [place] [facility]   PATIENT NAME: Wendy Meyer    MR#:  [id number]  DATE OF BIRTH:  02/06/1930  SUBJECTIVE:  CHIEF COMPLAINT:   Chief Complaint  Patient presents with  . Emesis   - patient from [facility] after her hip fracture repair recently - very lethargic, family at bedside  REVIEW OF SYSTEMS:  Review of Systems  Unable to perform ROS: Mental status change    DRUG ALLERGIES:   Allergies  Allergen Reactions  . Nsaids Other (See Comments)    Gastric upset.     VITALS:  Blood pressure (!) 98/46, pulse 78, temperature 98.6 F (37 C), temperature source Axillary, resp. rate 18, height 5\' 7"  (1.702 m), weight 63 kg (138 lb 14.2 oz), SpO2 98 %.  PHYSICAL EXAMINATION:  Physical Exam  GENERAL:  83 y.o.-year-old patient lying in the bed, lethargic, critically ill appearing.  EYES: Pupils equal, round, reactive to light and accommodation. No scleral icterus. Extraocular muscles intact.  HEENT: Head atraumatic, normocephalic. Oropharynx and nasopharynx clear. Very dry mucus membranes  NECK:  Supple, no jugular venous distention. No thyroid enlargement, no tenderness.  LUNGS: Normal breath sounds bilaterally, no wheezing, rales,rhonchi or crepitation. No use of accessory muscles of respiration. Decreased bibasilar breath sounds CARDIOVASCULAR: S1, S2 normal. No murmurs, rubs, or gallops.  ABDOMEN: Soft, nontender, nondistended. Bowel sounds present. No organomegaly or mass.  EXTREMITIES: No pedal edema, cyanosis, or clubbing.  NEUROLOGIC: very lethargic, withdrawing to pain, moaning to sternal rub PSYCHIATRIC: The patient is obtunded  SKIN: No obvious rash, lesion, or ulcer.    LABORATORY PANEL:   CBC  Recent Labs Lab 08/12/16 0107  WBC 5.9  HGB 9.6*  HCT 29.4*  PLT 219   ------------------------------------------------------------------------------------------------------------------  Chemistries   Recent  Labs Lab 08/11/16 1011 08/12/16 0107  NA 157* 156*  K 3.9 3.3*  CL 117* 119*  CO2 26 31  GLUCOSE 97 186*  BUN 52* 40*  CREATININE 1.37* 1.08*  CALCIUM 8.3* 7.8*  AST 17  --   ALT 9*  --   ALKPHOS 117  --   BILITOT 1.5*  --    ------------------------------------------------------------------------------------------------------------------  Cardiac Enzymes  Recent Labs Lab 08/12/16 0737  TROPONINI <0.03   ------------------------------------------------------------------------------------------------------------------  RADIOLOGY:  Dg Abd Acute W/chest  Result Date: 08/11/2016 CLINICAL DATA:  Recent vomiting EXAM: DG ABDOMEN ACUTE W/ 1V CHEST COMPARISON:  07/23/2016 FINDINGS: Cardiac shadow is mildly enlarged but stable. Aortic calcifications are again seen. Mild basilar scarring is again seen and stable from the prior exam. Scattered large and small bowel gas is noted. Mild retained fecal material is seen. Postoperative changes in the proximal right femur are noted. No acute bony abnormality is seen. IMPRESSION: Mild retained fecal material within the colon. No new focal abnormality is seen. Electronically Signed   By: Alcide Clever M.D.   On: 08/11/2016 11:50    EKG:   Orders placed or performed during the hospital encounter of 08/11/16  . EKG 12-Lead  . EKG 12-Lead  . EKG    ASSESSMENT AND PLAN:   83 y/o F with PMH significant for parkinsons disease, dementia, GERD, depression, osteoporosis who was recently discharged to rehab after hip fracture repair presents with sepsis  #1 Sepsis- secondary to acute cystitis - urine cultures pending, continue rocephin - f/u WBC  #2 Hypernatremia- free water deficit, fluids changed to D5 F/u later  #3 AMS- metabolic encephalopathy, from sepsis, hypernatremia - monitor closely  #4  Hypokalemia- being replaced  #5 Parkinsons disease- on sinemet, not alert enough to swallow meds yet Speech consult  #6 DVT prophylaxis- will  start SQ heparin since GI bleed resolved   #7 Hematemesis- resolved, discontinue GI consult, continue protonix BID - hb stable   Physical therapy pending when patient is more alert. Re-discuss CODE STATUS and goals of care tomorrow if no improvement.   All the records are reviewed and case discussed with Care Management/Social Workerr. Management plans discussed with the patient, family and they are in agreement.  CODE STATUS: Full Code  TOTAL TIME TAKING CARE OF THIS PATIENT: 39 minutes.   POSSIBLE D/C IN ? DAYS, DEPENDING ON CLINICAL CONDITION.   Enid BaasKALISETTI,Azhar Knope M.D on 08/12/2016 at 2:35 PM  Between 7am to 6pm - Pager - [phone]  After 6pm go to [email or web address] - Social research officer, governmentpassword EPAS [facility]  [facility] [facility]  Office  [phone]  CC: Primary care physician; Rolm GalaGRANDIS, HEIDI, MD

## 2016-08-12 NOTE — Progress Notes (Signed)
Initial Nutrition Assessment  DOCUMENTATION CODES:   Severe malnutrition in context of acute illness/injury  INTERVENTION:  Diet advancement per SLP/MD.  Recommend providing Ensure Enlive po TID with diet advancement, each supplement provides 350 kcal and 20 grams of protein. Patient prefers chocolate.  NUTRITION DIAGNOSIS:   Malnutrition (Severe) related to acute illness as evidenced by 6.7 percent weight loss over 3 weeks, moderate depletions of muscle mass, moderate depletion of body fat.  GOAL:   Patient will meet greater than or equal to 90% of their needs  MONITOR:   PO intake, Supplement acceptance, Diet advancement, Labs, Weight trends, I & O's  REASON FOR ASSESSMENT:   Low Braden    ASSESSMENT:   83 y.o. female with a known history of Parkinson's disease, recent admission in December 2017 for right intertrochanteric fracture due to fall, who presents to the hospital with complaints of vomiting, charcoal-like substance. Patient has been at [facility] after hip fracture. Found to have sepsis due to acute pyelonephritis/UTI, acute renal insufficiency, hypernatremia, metabolic encephalopathy. Also pending workup for coffee-ground emesis.   Patient sleeping at time of assessment but spoke with family members at bedside. Patient has had a poor appetite for years. Prior to hip fracture in December patient would eat 1/4 Bo-Berry biscuit in the morning and bites of Meals on Wheels for dinner, but would mainly meet needs with 4-5 Ensure Plus daily (each bottle has 350 kcal, 13 grams of protein). However since being at [facility] patient is eating </=15% of meals and hasn't been drinking Ensure as regularly.   Family unsure of UBW. RD obtained bed scale weight of 138.6 lbs (63 kg). Patient has lost 10 lbs (6.7% body weight) over 3 weeks, which is significant for time frame.   Medications reviewed and include: ceftriaxone, pantoprazole, D5-1/2NS @ 150 ml/hr (3.6 L/day, 180  grams dextrose, 612 kcal daily).  Labs reviewed: CBG 166, Sodium 156, Potassium 3.3, Chloride 119, BUN 40, Creatinine 1.08.   Nutrition-Focused physical exam completed. Findings are moderate fat depletion, moderate muscle depletion, and mild edema.   Discussed with RN. Patient failed initial bedside swallow evaluation and is pending evaluation from SLP before can be placed on diet.   Diet Order:     Skin:  Reviewed, no issues  Last BM:  08/11/2016  Height:   Ht Readings from Last 1 Encounters:  08/11/16 5\' 7"  (1.702 m)    Weight:   Wt Readings from Last 1 Encounters:  08/12/16 138 lb 14.2 oz (63 kg)    Ideal Body Weight:  61.4 kg  BMI:  Body mass index is 21.75 kg/m.  Estimated Nutritional Needs:   Kcal:  1345-1570 (MSJ x 1.2-1.4)  Protein:  65-75 grams (1-1.2 grams/kg)  Fluid:  >/= 1.5 L/day (25 ml/kg)  EDUCATION NEEDS:   No education needs identified at this time  Helane RimaLeanne Geanna Divirgilio, MS, RD, LDN Pager: [phone] After Hours Pager: [phone]

## 2016-08-12 NOTE — Progress Notes (Signed)
Patient's family updated on current plan of care. Granddaughter-in-law spoke with CSW, Fredric MareBailey, today about discharge planning to different facility.   Patient continues to be lethargic with intermittent times of alertness. NPO- speech re-evaluation tomorrow. IVF infusing as ordered. Potassium replacement infusing as ordered (K 3.3 currently). IV Abx given as ordered. Sodium remains elevated (Na 156) CBC to be drawn again at 1900. Blood and urine cultures pending.

## 2016-08-12 NOTE — Clinical Social Work Placement (Signed)
   CLINICAL SOCIAL WORK PLACEMENT  NOTE  Date:  08/12/2016  Patient Details  Name: Wendy Meyer MRN: [id number] Date of Birth: 23-May-1930  Clinical Social Work is seeking post-discharge placement for this patient at the Skilled  Nursing Facility level of care (*CSW will initial, date and re-position this form in  chart as items are completed):  Yes   Patient/family provided with [facility] Clinical Social Work Department's list of facilities offering this level of care within the geographic area requested by the patient (or if unable, by the patient's family).  Yes   Patient/family informed of their freedom to choose among providers that offer the needed level of care, that participate in Medicare, Medicaid or managed care program needed by the patient, have an available bed and are willing to accept the patient.  Yes   Patient/family informed of [facility]'s ownership interest in [facility] and [facility], as well as of the fact that they are under no obligation to receive care at these facilities.  PASRR submitted to EDS on       PASRR number received on       Existing PASRR number confirmed on 08/12/16     FL2 transmitted to all facilities in geographic area requested by pt/family on 08/12/16     FL2 transmitted to all facilities within larger geographic area on       Patient informed that his/her managed care company has contracts with or will negotiate with certain facilities, including the following:        Yes   Patient/family informed of bed offers received.  Patient chooses bed at  [facility] [facility]     Physician recommends and patient chooses bed at      Patient to be transferred to   on  .  Patient to be transferred to facility by       Patient family notified on   of transfer.  Name of family member notified:        PHYSICIAN       Additional Comment:    _______________________________________________ Jailin Manocchio, [name], LCSW 08/12/2016, 4:03  PM

## 2016-08-12 NOTE — Plan of Care (Signed)
Problem: Education: Goal: Knowledge of [facility] Education information/materials will improve Outcome: Progressing VSS, free of falls during shift.  No complaints overnight.  Mouth care provided.  Bed in low position, bed alarm on.  Call bell within reach, WCTM.

## 2016-08-12 NOTE — Evaluation (Signed)
Clinical/Bedside Swallow Evaluation Patient Details  Name: Wendy Meyer MRN: [id number] Date of Birth: 01/26/1930  Today's Date: 08/12/2016 Time: SLP Start Time (ACUTE ONLY): 1000 SLP Stop Time (ACUTE ONLY): 1100 SLP Time Calculation (min) (ACUTE ONLY): 60 min  Past Medical History:  Past Medical History:  Diagnosis Date  . Parkinson disease [facility])    Past Surgical History:  Past Surgical History:  Procedure Laterality Date  . INTRAMEDULLARY (IM) NAIL INTERTROCHANTERIC Right 07/20/2016   Procedure: INTRAMEDULLARY (IM) NAIL INTERTROCHANTRIC;  Surgeon: Deeann Saint, MD;  Location: [facility] ORS;  Service: Orthopedics;  Laterality: Right;   HPI:  Pt is a 83 y.o. female with a known history of Parkinson's disease and Dementia who comes to the emergency room with complaints of vomiting, charcoal-like substance. Pt had a mechanical fall at home and was admitted for hip fracture in December 2017 then discharged to SNF for Rehab. She has been taking little oral intake since then per report. Per family she has been having issues with memory and mixes up her heart disease medication at night when at home and has had a decline in functional status in the past 2-3 years even prior to death of husband ~2 years ago. Pt has had decreased desire for oral intake, more sweets. She drinks more effectively than eats full meals per Sister's report last admission. Pt has been given an appetite stimulant to address this. Labs revealed severe hypernatremia, renal insufficiency, urinary tract infection, patient complains of some flank pain, although it is unclear if these are chronic pains. Currently, pt is drowsy and difficult to arouse. She opened eyes briefly to verbal/tactile stimluation.    Assessment / Plan / Recommendation Clinical Impression  Pt appears at severe risk for aspiration secondary to her declined Cognitive status(baseline Dementia), lethargy, and the reduced Oral phase awareness exhibited during  this evaluation today. Pt required mod-max verbal/tactile cues throughout this evaluation and only exhibited intermittent eye opening and soft phonations. Pt exhibited oral phase deficits c/b reduced oral awareness for closing mouth to tactile stim of bolus/spoon and oral holding w/ no lingual-oral movements for A-P transfer for swallowing. Bolus material was removed from her mouth. No other trials of thin liquids or solids were given at this time d/t the lethargy and Cognitive decline presentation as such can increase risk for aspiration and choking. Recommend remain NPO at this time w/ strict aspiration precautions w/ oral care. ST services will f/u w/ pt's status for appropriateness for attempting po trials to hopefully establish an oral diet. NSG updated; no family present.     Aspiration Risk  Severe aspiration risk    Diet Recommendation  NPO status w/ oral care/swabs for hygiene and stimulation; strict aspiration precautions and monitoring  Medication Administration: Via alternative means    Other  Recommendations Recommended Consults:  (Dietician f/u) Oral Care Recommendations: Oral care QID;Staff/trained caregiver to provide oral care Other Recommendations:  (TBD)   Follow up Recommendations Skilled Nursing facility      Frequency and Duration min 3x week  2 weeks       Prognosis Prognosis for Safe Diet Advancement: Guarded Barriers to Reach Goals: Cognitive deficits;Severity of deficits      Swallow Study   General Date of Onset: 08/11/16 HPI: Pt is a 83 y.o. female with a known history of Parkinson's disease and Dementia who comes to the emergency room with complaints of vomiting, charcoal-like substance. Pt had a mechanical fall at home and was admitted for hip fracture in December 2017  then discharged to SNF for Rehab. She has been taking little oral intake since then per report. Per family she has been having issues with memory and mixes up her heart disease medication at  night when at home and has had a decline in functional status in the past 2-3 years even prior to death of husband ~2 years ago. Pt has had decreased desire for oral intake, more sweets. She drinks more effectively than eats full meals per Sister's report last admission. Pt has been given an appetite stimulant to address this. Labs revealed severe hypernatremia, renal insufficiency, urinary tract infection, patient complains of some flank pain, although it is unclear if these are chronic pains. Currently, pt is drowsy and difficult to arouse. She opened eyes briefly to verbal/tactile stimluation.  Type of Study: Bedside Swallow Evaluation Previous Swallow Assessment: 12.22.17 Diet Prior to this Study: Dysphagia 1 (puree);Nectar-thick liquids (recommended last admission to SNF) Temperature Spikes Noted: No (wbc 5.9) Respiratory Status: Nasal cannula (2 liters) History of Recent Intubation: No Behavior/Cognition: Confused;Lethargic/Drowsy;Distractible;Requires cueing;Doesn't follow directions Oral Cavity Assessment: Dry;Dried secretions Oral Care Completed by SLP: Yes Oral Cavity - Dentition: Adequate natural dentition Vision:  (n/a) Self-Feeding Abilities: Total assist Patient Positioning: Upright in bed Baseline Vocal Quality:  (nonverbal; phonated x2) Volitional Cough: Cognitively unable to elicit Volitional Swallow: Unable to elicit    Oral/Motor/Sensory Function Overall Oral Motor/Sensory Function:  (difficult to assess d/t Cognitive status; reduced)   Ice Chips Ice chips: Not tested   Thin Liquid Thin Liquid: Not tested    Nectar Thick Nectar Thick Liquid: Not tested   Honey Thick Honey Thick Liquid: Not tested   Puree Puree: Impaired Presentation: Spoon (fed; 1 trial) Oral Phase Impairments: Reduced labial seal;Reduced lingual movement/coordination;Poor awareness of bolus (allowed bolus to lay in the mouth) Oral Phase Functional Implications: Oral holding (no awareness or attempt to  transfer or swallow) Pharyngeal Phase Impairments:  (n/a) Other Comments:  bolus was removed from pt's mouth   Solid   GO   Solid: Not tested         Jerilynn SomKatherine Annais Crafts, MS, CCC-SLP Natisha Trzcinski 08/12/2016,3:36 PM

## 2016-08-12 NOTE — Progress Notes (Addendum)
Dr. Nemiah CommanderKalisetti notified when rounding on unit around 1400 that patient has not voided since charted at 0509am. Verbal order read back and verified for bladder scan and in/out cath if greater than 300ml.  Bladder scan showed greater than 988ml.  In/Out Cath performed with Kandis Cockingebbie Werner NT; 1600ml output.  Urine amber/tea colored w/ cream puss-like liquid at times.  Urine culture pending.

## 2016-08-12 NOTE — Plan of Care (Signed)
Problem: SLP Dysphagia Goals Goal: Misc Dysphagia Goal Pt will safely tolerate po diet of least restrictive consistency w/ no overt s/s of aspiration noted by Staff/pt/family x3 sessions.    

## 2016-08-12 NOTE — Clinical Social Work Note (Signed)
Clinical Social Work Assessment  Patient Details  Name: Wendy SitesGeraldine A Szydlowski MRN: [id number] Date of Birth: 03-Jul-1930  Date of referral:  08/12/16               Reason for consult:  Facility Placement, Other (Comment Required) (From [facility] )                Permission sought to share information with:  Oceanographeracility Contact Representative Permission granted to share information::  Yes, Verbal Permission Granted  Name::      Skilled Nursing Facility  Agency::   [place]   Relationship::     Contact Information:     Housing/Transportation Living arrangements for the past 2 months:  Skilled Holiday representativeursing Facility, Single Family Home Source of Information:  Facility, Power of Attorney Patient Interpreter Needed:  None Criminal Activity/Legal Involvement Pertinent to Current Situation/Hospitalization:  No - Comment as needed Significant Relationships:  Adult Children, Other Family Members Lives with:  Facility Resident Do you feel safe going back to the place where you live?    Need for family participation in patient care:  Yes (Comment)  Care giving concerns: Patient was placed at [facility] after a hip fracture.    Social Worker assessment / plan:  Visual merchandiserClinical Social Worker (CSW) reviewed chart and noted that patient is from [facility]. CSW is familiar with patient and family from previous admission on 1A in December 2017. Per [facility] admissions coordinator at [facility] the family is not happy with the [facility] at [facility] and does not want her to return. Per MD patient will not be stable for D/C over the weekend. CSW attempted to meet with patient however she was confused and alone at bedside. CSW contacted patient's granddaughter/ HPOA Judeth CornfieldStephanie [phone]. Per Judeth CornfieldStephanie patient's UTI was caused by [facility] and the fact that they did not "clean her as well as they should." Per Judeth CornfieldStephanie patient may not survive this.  Judeth CornfieldStephanie and her husband Lyn Hollingsheadlexander share HPOA. Per Lyn HollingsheadAlexander if patient does survive this hospital admission they do not want her to go back to [facility] they would prefer [facility]. FL2 complete and faxed out. CSW presented bed offers to Lyn HollingsheadAlexander and he chose [facility]. [facility] is aware of accepted bed offer. CSW will continue to follow and assist as needed.   Employment status:  Retired Database administratornsurance information:  Managed Medicare PT Recommendations:  Not assessed at this time Information / Referral to community resources:  Skilled Nursing Facility  Patient/Family's Response to care:  Patient's family does not want her to return to [facility].   Patient/Family's Understanding of and Emotional Response to Diagnosis, Current Treatment, and Prognosis:  Patient's family is aware of prognosis and stated that she may not survive this admission. CSW provided emotional support.   Emotional Assessment Appearance:  Appears stated age Attitude/Demeanor/Rapport:  Unable to Assess Affect (typically observed):  Unable to Assess Orientation:  Oriented to Self, Fluctuating Orientation (Suspected and/or reported Sundowners) Alcohol / Substance use:  Not Applicable Psych involvement (Current and /or in the community):  No (Comment)  Discharge Needs  Concerns to be addressed:  Discharge Planning Concerns Readmission within the last 30 days:  Yes Current discharge risk:  Chronically ill, Cognitively Impaired, Dependent with Mobility Barriers to Discharge:  Continued Medical Work up   [employer], [name], LCSW 08/12/2016, 4:04 PM

## 2016-08-12 NOTE — Evaluation (Signed)
Physical Therapy Evaluation Patient Details Name: Wendy Meyer MRN: [id number] DOB: January 26, 1930 Today's Date: 08/12/2016   History of Present Illness  Pt is a 83 y/o F from [facility] with c/o vomiting a charcoal-like substance.  Labs revealed severe hypernatremia, renal insufficiency and a urinary tract infection.  Pt admitted for sepsis due to acute pyelonephritis/UTI.  She had R hip IM nail on 07/20/16 after falling.  Pt's PMH includes Parkinson's.    Clinical Impression  Pt admitted with above diagnosis. Pt currently with functional limitations due to the deficits listed below (see PT Problem List). Wendy Meyer presents very lethargic and only opens eyes when light is turned on or when stimulated and with verbal cues to open her eyes.  She provides ~20% assist with AAROM therapeutic exercises in supine.  Per Granddaughter pt was ambulating 20-30 ft with RW and assist at [facility].  Given pt's current mobility status and ability to participate in therapeutic interventions, recommending SNF at d/c.  Family preference is for [facility]. Pt will benefit from skilled PT to increase their independence and safety with mobility to allow discharge to the venue listed below.      Follow Up Recommendations SNF    Equipment Recommendations  None recommended by PT    Recommendations for Other Services OT consult     Precautions / Restrictions Precautions Precautions: Fall Restrictions Weight Bearing Restrictions: Yes RLE Weight Bearing: Partial weight bearing RLE Partial Weight Bearing Percentage or Pounds: Called Dr. Deeann SaintHoward Miller who peformed pt's surgery on 07/20/16 to clarify WB status but no answer.  Per chart review, pt with PWB status following surgery and thus will continue with this status unless notified otherwise.      Mobility  Bed Mobility               General bed mobility comments: Unable to assess due to pt's level of arousal  Transfers                    Ambulation/Gait                Stairs            Wheelchair Mobility    Modified Rankin (Stroke Patients Only)       Balance                                             Pertinent Vitals/Pain Pain Assessment: Faces Faces Pain Scale: Hurts little more Pain Location: Pt grimacing with RLE therapeutic exercises Pain Descriptors / Indicators: Grimacing;Moaning Pain Intervention(s): Limited activity within patient's tolerance;Monitored during session;Repositioned    Home Living Family/patient expects to be discharged to:: Skilled nursing facility                 Additional Comments: Pt from [facility] after being d/c from hospital following surgery on 07/20/16.    Prior Function Level of Independence: Needs assistance   Gait / Transfers Assistance Needed: Grandaughter was told by SNF that pt was ambualting 20-30 ft with RW and assist from PT and that she was able to sit EOB and comb her hair with supervision.  ADL's / Homemaking Assistance Needed: Pt likely required assist for bathing, dressing.        Hand Dominance        Extremity/Trunk Assessment   Upper Extremity Assessment Upper Extremity  Assessment: Generalized weakness (Difficult to assess due to pt's level of arousal.)    Lower Extremity Assessment Lower Extremity Assessment: Generalized weakness;Difficult to assess due to impaired cognition;RLE deficits/detail;LLE deficits/detail RLE Deficits / Details: Difficult to assess due to pt's level of arousal.  Pt assisting ~20% for thearpeutic exercises with this PT assisting with the rest. LLE Deficits / Details: Difficult to assess due to pt's level of arousal.  Pt assisting ~20% for thearpeutic exercises with this PT assisting with the rest.       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  This PT turned on the overhead light and pt opened her eyes and answered orientation questions (pt oriented to self only).  Pt requires constant stimulation to remain awake and verbal cues to open her eyes.    General Comments General comments (skin integrity, edema, etc.): Pt found with R hip IR supine in bed.  At end of session positioned towel medial to RLE to place pt's R hip in a more neutral position.    Exercises Total Joint Exercises Heel Slides: AAROM;Both;10 reps;Supine Hip ABduction/ADduction: AAROM;Both;10 reps;Supine General Exercises - Lower Extremity Ankle Circles/Pumps: AAROM;Both;10 reps;Supine Straight Leg Raises: AAROM;Both;10 reps;Supine   Assessment/Plan    PT Assessment Patient needs continued PT services  PT Problem List Decreased strength;Decreased range of motion;Decreased activity tolerance;Decreased balance;Decreased mobility;Decreased cognition;Decreased knowledge of use of DME;Decreased safety awareness;Decreased knowledge of precautions;Pain          PT Treatment Interventions DME instruction;Gait training;Functional mobility training;Therapeutic activities;Therapeutic exercise;Balance training;Neuromuscular re-education;Cognitive remediation;Patient/family education;Wheelchair mobility training;Modalities    PT Goals (Current goals can be found in the Care Plan section)  Acute Rehab PT Goals Patient Stated Goal: Per family for pt to go to [facility] SNF at d/c PT Goal Formulation: With patient Time For Goal Achievement: 08/26/16 Potential to Achieve Goals: Fair    Frequency Min 2X/week   Barriers to discharge        Co-evaluation               End of Session   Activity Tolerance: Patient limited by lethargy Patient left: in bed;with call bell/phone within  reach;with bed alarm set;with family/visitor present;with SCD's reapplied;Other (comment) (with towel roll to place R hip in neutral position) Nurse Communication: Mobility status;Other (comment);Weight bearing status (pt's level of arousal)         Time: 1610-9604 PT Time Calculation (min) (ACUTE ONLY): 19 min   Charges:   PT Evaluation $PT Eval Low Complexity: 1 Procedure     PT G Codes:        Encarnacion Chu PT, DPT 08/12/2016, 3:34 PM

## 2016-08-13 LAB — CBC
HCT: 28 % — ABNORMAL LOW (ref 35.0–47.0)
HEMOGLOBIN: 9.3 g/dL — AB (ref 12.0–16.0)
MCH: 31.2 pg (ref 26.0–34.0)
MCHC: 33.1 g/dL (ref 32.0–36.0)
MCV: 94.2 fL (ref 80.0–100.0)
PLATELETS: 179 10*3/uL (ref 150–440)
RBC: 2.97 MIL/uL — AB (ref 3.80–5.20)
RDW: 16.7 % — ABNORMAL HIGH (ref 11.5–14.5)
WBC: 5.8 10*3/uL (ref 3.6–11.0)

## 2016-08-13 LAB — BASIC METABOLIC PANEL
ANION GAP: 4 — AB (ref 5–15)
BUN: 24 mg/dL — ABNORMAL HIGH (ref 6–20)
CO2: 31 mmol/L (ref 22–32)
CREATININE: 0.93 mg/dL (ref 0.44–1.00)
Calcium: 8.1 mg/dL — ABNORMAL LOW (ref 8.9–10.3)
Chloride: 114 mmol/L — ABNORMAL HIGH (ref 101–111)
GFR calc non Af Amer: 55 mL/min — ABNORMAL LOW (ref >60)
Glucose, Bld: 119 mg/dL — ABNORMAL HIGH (ref 65–99)
Potassium: 3.6 mmol/L (ref 3.5–5.1)
SODIUM: 149 mmol/L — AB (ref 135–145)

## 2016-08-13 LAB — GLUCOSE, CAPILLARY: Glucose-Capillary: 118 mg/dL — ABNORMAL HIGH (ref 65–99)

## 2016-08-13 MED ORDER — PANTOPRAZOLE SODIUM 40 MG PO TBEC
40.0000 mg | DELAYED_RELEASE_TABLET | Freq: Two times a day (BID) | ORAL | Status: DC
  Administered 2016-08-13: 14:00:00 40 mg via ORAL
  Filled 2016-08-13: qty 1

## 2016-08-13 MED ORDER — DEXTROSE 5 % IV SOLN
INTRAVENOUS | Status: DC
  Administered 2016-08-13: 14:00:00 via INTRAVENOUS

## 2016-08-13 MED ORDER — LORAZEPAM 2 MG/ML IJ SOLN
0.5000 mg | INTRAMUSCULAR | Status: DC | PRN
  Administered 2016-08-14 (×2): 0.5 mg via INTRAVENOUS
  Filled 2016-08-13 (×2): qty 1

## 2016-08-13 MED ORDER — MORPHINE SULFATE (CONCENTRATE) 10 MG/0.5ML PO SOLN
10.0000 mg | ORAL | Status: DC | PRN
  Administered 2016-08-14: 10:00:00 10 mg via ORAL
  Filled 2016-08-13: qty 1

## 2016-08-13 NOTE — Progress Notes (Signed)
[facility] - [place] at [facility]   PATIENT NAME: Ulyses JarredGeraldine Wiegert    MR#:  [id number]  DATE OF BIRTH:  03/04/1930  SUBJECTIVE:  CHIEF COMPLAINT:   Chief Complaint  Patient presents with  . Emesis   - [facility] after her hip fracture repair recently -Labs are improving. Patient still remains very weak, more arousable today than yesterday. -Urinary retention noted and has a Foley catheter  REVIEW OF SYSTEMS:  Review of Systems  Unable to perform ROS: Mental status change    DRUG ALLERGIES:   Allergies  Allergen Reactions  . Nsaids Other (See Comments)    Gastric upset.     VITALS:  Blood pressure (!) 116/51, pulse 81, temperature 98.2 F (36.8 C), temperature source Oral, resp. rate 20, height 5\' 7"  (1.702 m), weight 63 kg (138 lb 14.2 oz), SpO2 97 %.  PHYSICAL EXAMINATION:  Physical Exam  GENERAL:  83 y.o.-year-old patient lying in the bed, ill appearing.  EYES: Pupils equal, round, reactive to light and accommodation. No scleral icterus. Extraocular muscles intact.  HEENT: Head atraumatic, normocephalic. Oropharynx and nasopharynx clear. Very dry mucus membranes  NECK:  Supple, no jugular venous distention. No thyroid enlargement, no tenderness.  LUNGS: Normal breath sounds bilaterally, no wheezing, rales,rhonchi or crepitation. No use of accessory muscles of respiration. Decreased bibasilar breath sounds CARDIOVASCULAR: S1, S2 normal. No murmurs, rubs, or gallops.  ABDOMEN: Soft, nontender, nondistended. Bowel sounds present. No organomegaly or mass.  EXTREMITIES: No pedal edema, cyanosis, or clubbing.  NEUROLOGIC: Following some simple commands. Global weakness noted. Sensation is intact PSYCHIATRIC: The patient is arousable, following some simple commands. Answering yes or no to simple questions. SKIN: No obvious rash, lesion, or ulcer.    LABORATORY PANEL:   CBC  Recent Labs Lab 08/13/16 0606  WBC 5.8  HGB 9.3*    HCT 28.0*  PLT 179   ------------------------------------------------------------------------------------------------------------------  Chemistries   Recent Labs Lab 08/11/16 1011  08/13/16 0606  NA 157*  < > 149*  K 3.9  < > 3.6  CL 117*  < > 114*  CO2 26  < > 31  GLUCOSE 97  < > 119*  BUN 52*  < > 24*  CREATININE 1.37*  < > 0.93  CALCIUM 8.3*  < > 8.1*  AST 17  --   --   ALT 9*  --   --   ALKPHOS 117  --   --   BILITOT 1.5*  --   --   < > = values in this interval not displayed. ------------------------------------------------------------------------------------------------------------------  Cardiac Enzymes  Recent Labs Lab 08/12/16 0737  TROPONINI <0.03   ------------------------------------------------------------------------------------------------------------------  RADIOLOGY:  No results found.  EKG:   Orders placed or performed during the hospital encounter of 08/11/16  . EKG 12-Lead  . EKG 12-Lead  . EKG    ASSESSMENT AND PLAN:   83 y/o F with PMH significant for parkinsons disease, dementia, GERD, depression, osteoporosis who was recently discharged to rehab after hip fracture repair presents with sepsis  #1 Sepsis- secondary to acute cystitis - urine cultures Growing gram-negative rods, continue rocephin - Normalized WBC  #2 Hypernatremia- free water deficit, fluids changed to D5 Improving. Continue to follow up. Poor oral intake still  #3 AMS- metabolic encephalopathy, from sepsis, hypernatremia - monitor closely -Some improvement noted today  #4 Hypokalemia- replaced  #5 Parkinsons disease- on sinemet, Speech consult- Started on nectar thick liquids with pured diet today  #6  DVT prophylaxis- started SQ heparin since GI bleed resolved   #7 Hematemesis- resolved, discontinued GI consult, continue protonix BID- change to PO - hb stable   Physical therapy pending when patient is more alert. Overall poor prognosis. Frail health.  Discussed with granddaughter in law and grandson who are healthcare power of attorney is. Change CODE STATUS to DO NOT RESUSCITATE.   All the records are reviewed and case discussed with Care Management/Social Workerr. Management plans discussed with the patient, family and they are in agreement.  CODE STATUS: DNR  TOTAL TIME TAKING CARE OF THIS PATIENT: 32 minutes.   POSSIBLE D/C IN ? DAYS, DEPENDING ON CLINICAL CONDITION.   Enid Baas M.D on 08/13/2016 at 12:37 PM  Between 7am to 6pm - Pager - [phone]  After 6pm go to [email or web address] - Social research officer, government  [facility]  Office  [phone]  CC: Primary care physician; Rolm Gala, MD

## 2016-08-13 NOTE — Progress Notes (Signed)
Speech Language Pathology Treatment: Dysphagia  Patient Details Name: Lennice SitesGeraldine A Priola MRN: [id number] DOB: [birth year] Today's Date: 08/13/2016 Time: 1010-1030 SLP Time Calculation (min) (ACUTE ONLY): 20 min  Assessment / Plan / Recommendation Clinical Impression  Skilled treatment session focused on dysphagia goals. Pt alert and able to maintain arousal. Family present. Provides that baseline diet at SNF was puree with thin liquids. SLP facilitated session by providing skilled observation with trials of ice hips, thin liquids and nectar thick liquids all via tsp. Pt with immediate cough on trials of ice chips and thin liquids possibility d/t decreased hyolaryngeal excursion and delayed swallow initiation. Pt's swallow appeared more timely with trials of nectar via spoon and no overt s/s of aspiration noted. Pt with increased oral prep and lingual pumping with tsp trials of puree but cleared well when alternating liquids and puree. Recommend conservative diet of puree with nectar thick via tsp, medicine crushed in puree as tolerated. MUST FOLLOW STRICT ASPIRATION PRECAUTIONS - INCLUDING ALTERNATING LIQUIDS AND PUREE. Education provided to nursing and family members.    HPI HPI: Pt is a 83 y.o. female with a known history of Parkinson's disease and Dementia who comes to the emergency room with complaints of vomiting, charcoal-like substance. Pt had a mechanical fall at home and was admitted for hip fracture in December 2017 then discharged to SNF for Rehab. She has been taking little oral intake since then per report. Per family she has been having issues with memory and mixes up her heart disease medication at night when at home and has had a decline in functional status in the past 2-3 years even prior to death of husband ~2 years ago. Pt has had decreased desire for oral intake, more sweets. She drinks more effectively than eats full meals per Sister's report last admission. Pt has been given an  appetite stimulant to address this. Labs revealed severe hypernatremia, renal insufficiency, urinary tract infection, patient complains of some flank pain, although it is unclear if these are chronic pains. Currently, pt is drowsy and difficult to arouse. She opened eyes briefly to verbal/tactile stimluation.       SLP Plan  Continue with current plan of care     Recommendations  Diet recommendations: Dysphagia 1 (puree);Nectar-thick liquid Liquids provided via: Teaspoon Medication Administration: Crushed with puree (As pt tolerates) Supervision: Staff to assist with self feeding;Full supervision/cueing for compensatory strategies Compensations: Minimize environmental distractions;Slow rate;Small sips/bites;Follow solids with liquid (Alternate puree bolus with nectar bolus) Postural Changes and/or Swallow Maneuvers: Seated upright 90 degrees                Oral Care Recommendations: Oral care BID Follow up Recommendations: Skilled Nursing facility Plan: Continue with current plan of care       GO             Eldine Rencher B. Dreama Saaverton, M.S., CCC-SLP Speech-Language Pathologist     Kensli Bowley 08/13/2016, 10:30 AM

## 2016-08-13 NOTE — Progress Notes (Signed)
   [facility] - [place] at [facility]   Advance care planning  Hospital Day: 2 days Wendy JarredGeraldine Finklea is a 83 y.o. female presenting with Emesis .Has h/o parkinsons disease, dementia worsening lately  Advance care planning discussed with Family - stephanie granddaughter- in-law and grandson who are the healthcare power of attorneys and they understand the disease process and poor prognosis now. Patient cannot participate in the conversation due to her dementia and critical illness now. All questions in regards to overall condition and expected prognosis answered. The decision was made to change her CODE STATUS to DO NOT RESUSCITATE from full code  CODE STATUS: DO NOT RESUSCITATE Time spent: 18 minutes

## 2016-08-13 NOTE — Progress Notes (Signed)
BP has been borderline, further drop noted. Already receiving fluids, little clinical improvement.  Discussed with HCPOA - grandson Trinna Postlex and grand daughter in law stephanie and grand daughter Hospital doctorAmber. All have agreed to make patient comfort care.  No blood draws, no labs, fluids support. We will start comfort meds.

## 2016-08-14 LAB — URINE CULTURE: Culture: >=100000 — AB

## 2016-08-14 MED ORDER — ENSURE ENLIVE PO LIQD: 237.0000 mL | Freq: Two times a day (BID) | ORAL | Status: DC

## 2016-08-14 MED ORDER — MORPHINE SULFATE (PF) 2 MG/ML IV SOLN
2.0000 mg | INTRAVENOUS | Status: DC | PRN
  Administered 2016-08-15 (×2): 2 mg via INTRAVENOUS
  Filled 2016-08-14 (×2): qty 1

## 2016-08-14 MED ORDER — MORPHINE SULFATE (CONCENTRATE) 10 MG/0.5ML PO SOLN
10.0000 mg | ORAL | Status: DC | PRN
  Administered 2016-08-14 (×3): 10 mg via ORAL
  Filled 2016-08-14 (×3): qty 1

## 2016-08-14 MED ORDER — MORPHINE SULFATE (CONCENTRATE) 10 MG/0.5ML PO SOLN: 10.0000 mg | ORAL | Status: DC | PRN

## 2016-08-14 NOTE — Progress Notes (Signed)
[facility] - [place] at [facility]   PATIENT NAME: Wendy Meyer    MR#:  [id number]  DATE OF BIRTH:  March 14, 1930  SUBJECTIVE:  CHIEF COMPLAINT:   Chief Complaint  Patient presents with  . Emesis   - transitioned to comfort care - appears comfortable, family at bedside  REVIEW OF SYSTEMS:  Review of Systems  Unable to perform ROS: Mental status change    DRUG ALLERGIES:   Allergies  Allergen Reactions  . Nsaids Other (See Comments)    Gastric upset.     VITALS:  Blood pressure (!) 99/45, pulse 77, temperature 98.2 F (36.8 C), resp. rate 20, height 5\' 7"  (1.702 m), weight 63 kg (138 lb 14.2 oz), SpO2 94 %.  PHYSICAL EXAMINATION:  Physical Exam  GENERAL:  83 y.o.-year-old patient lying in the bed, ill appearing. Appears comfortable EYES: Pupils equal, round, reactive to light and accommodation. No scleral icterus. Extraocular muscles intact.  HEENT: Head atraumatic, normocephalic. Oropharynx and nasopharynx clear. Very dry mucus membranes  NECK:  Supple, no jugular venous distention. No thyroid enlargement, no tenderness.  LUNGS: Normal breath sounds bilaterally, no wheezing, rales,rhonchi or crepitation. No use of accessory muscles of respiration. Decreased bibasilar breath sounds CARDIOVASCULAR: S1, S2 normal. No murmurs, rubs, or gallops.  ABDOMEN: Soft, nontender, nondistended. Bowel sounds present. No organomegaly or mass.  EXTREMITIES: No pedal edema, cyanosis, or clubbing.  NEUROLOGIC: Obtunded and not following any commands. PSYCHIATRIC: The patient is obtunded SKIN: No obvious rash, lesion, or ulcer.    LABORATORY PANEL:   CBC  Recent Labs Lab 08/13/16 0606  WBC 5.8  HGB 9.3*  HCT 28.0*  PLT 179   ------------------------------------------------------------------------------------------------------------------  Chemistries   Recent Labs Lab 08/11/16 1011  08/13/16 0606  NA 157*  < > 149*  K 3.9  < > 3.6  CL 117*  <  > 114*  CO2 26  < > 31  GLUCOSE 97  < > 119*  BUN 52*  < > 24*  CREATININE 1.37*  < > 0.93  CALCIUM 8.3*  < > 8.1*  AST 17  --   --   ALT 9*  --   --   ALKPHOS 117  --   --   BILITOT 1.5*  --   --   < > = values in this interval not displayed. ------------------------------------------------------------------------------------------------------------------  Cardiac Enzymes  Recent Labs Lab 08/12/16 0737  TROPONINI <0.03   ------------------------------------------------------------------------------------------------------------------  RADIOLOGY:  No results found.  EKG:   Orders placed or performed during the hospital encounter of 08/11/16  . EKG 12-Lead  . EKG 12-Lead  . EKG    ASSESSMENT AND PLAN:   83 y/o F with PMH significant for parkinsons disease, dementia, GERD, depression, osteoporosis who was recently discharged to rehab after hip fracture repair presents with sepsis.  Sepsis is secondary to acute cystitis and urine cultures growing pseudomonas. Patient also had significant hypernatremia from poor oral intake. Metabolic encephalopathy present as well on admission. Her overall condition has been gradually declining even prior to admission and has since clinically much worsened. Family meeting yesterday. Healthcare power of attorney is at bedside. Patient is made DO NOT RESUSCITATE and they have decided to change her status to comfort care. -Patient is on comfort medications. Appears comfortable. Plan to transition to hospice home if she is stable.  Final diagnosis:  #1 Sepsis- secondary to acute cystitis - urine cultures Growing Pseudomonas   #2 Hypernatremia  #3 AMS- metabolic encephalopathy,   #  4 Hypokalemia- replaced  #5 Parkinsons disease-   #7 Hematemesis- resolved, - hb stable   Comfort care only now. Plan to transition to hospice home when bed available if patient is stable tomorrow  All the records are reviewed and case discussed with Care  Management/Social Workerr. Management plans discussed with the patient, family and they are in agreement.  CODE STATUS: DNR  TOTAL TIME TAKING CARE OF THIS PATIENT: 26 minutes.   POSSIBLE D/C tomorrow, DEPENDING ON CLINICAL CONDITION.   Enid BaasKALISETTI,Rena Sweeden M.D on 08/14/2016 at 12:04 PM  Between 7am to 6pm - Pager - [phone]  After 6pm go to [email or web address] - Social research officer, governmentpassword EPAS [facility]  [facility] [place] [facility]  Office  [phone]  CC: Primary care physician; Rolm GalaGRANDIS, HEIDI, MD

## 2016-08-14 NOTE — Clinical Social Work Note (Signed)
CSW received bed offer from [facility]. CSW contacted Dr. Nemiah CommanderKalisetti for dc summary, at which point Dr. Nemiah CommanderKalisetti advised that she would consult with RN Aundra MilletMegan to assess if the patient is stable enough for transfer. According to [name] from [facility], RN Aundra MilletMegan had indicated that the patient would be stable. According to Dr. Nemiah CommanderKalisetti, RN Aundra MilletMegan indicated that the patient has declined in the past hour. Bed offer is still available pending any changes. CSW informed Eunice BlaseDebbie and RN Aundra MilletMegan so that the family could be informed of the change in status. CSW will con't to follow.  Wendy Meyer, MSW, Theresia MajorsLCSWA [phone]

## 2016-08-14 NOTE — Plan of Care (Signed)
Problem: Activity: Goal: Risk for activity intolerance will decrease Outcome: Not Progressing Pt's condition declining; family considering Comfort Care; emotional support given throughout the shift to pt and her family

## 2016-08-14 NOTE — Clinical Social Work Note (Signed)
CSW received verbal consult from MD for referral to hospice facility. CSW contacted [name], RN admissions coordinator for [facility]. Referral packet sent. [name] will advise once assessed. CSW following.  Wendy Meyer Wendy Meyer, MSW, LCSWA [phone] 

## 2016-08-15 LAB — GLUCOSE, CAPILLARY: Glucose-Capillary: 100 mg/dL — ABNORMAL HIGH (ref 65–99)

## 2016-08-15 MED ORDER — MORPHINE SULFATE (CONCENTRATE) 10 MG/0.5ML PO SOLN: 20.0000 mg | ORAL | 0 refills | Status: AC | PRN

## 2016-08-15 MED ORDER — MORPHINE SULFATE (CONCENTRATE) 10 MG/0.5ML PO SOLN
20.0000 mg | ORAL | Status: DC | PRN
  Administered 2016-08-15: 20 mg via ORAL
  Filled 2016-08-15: qty 1

## 2016-08-15 MED ORDER — LORAZEPAM 2 MG/ML IJ SOLN: 1.0000 mg | INTRAMUSCULAR | Status: DC | PRN

## 2016-08-15 MED ORDER — LORAZEPAM 2 MG/ML PO CONC: 1.0000 mg | ORAL | 0 refills | Status: AC | PRN

## 2016-08-15 NOTE — Plan of Care (Signed)
Problem: Education: Goal: Knowledge of [facility] Education information/materials will improve Outcome: Not Progressing Pt [facility]

## 2016-08-15 NOTE — Discharge Summary (Signed)
[facility] [place] [facility]   PATIENT NAME: Wendy Meyer    MR#:  [id number]  DATE OF BIRTH:  20-Jun-1930  DATE OF ADMISSION:  08/11/2016   ADMITTING PHYSICIAN: Katharina Caper, MD  DATE OF DISCHARGE: 08/15/2016  PRIMARY CARE PHYSICIAN: Rolm Gala, MD   ADMISSION DIAGNOSIS:   Lower urinary tract infectious disease [N39.0] Non-intractable vomiting without nausea, unspecified vomiting type [R11.11]  DISCHARGE DIAGNOSIS:   Principal Problem:   Sepsis (HCC) Active Problems:   Acute pyelonephritis   Acute renal insufficiency   Dehydration   Hypernatremia   Guaiac positive stools   Nausea & vomiting   SECONDARY DIAGNOSIS:   Past Medical History:  Diagnosis Date  . Parkinson disease [facility])     HOSPITAL COURSE:   83 y/o F with PMH significant for parkinsons disease, dementia, GERD, depression, osteoporosis who was recently discharged to rehab after hip fracture repair presents with sepsis.  Sepsis is secondary to acute cystitis and urine cultures growing pseudomonas. Patient also had significant hypernatremia from poor oral intake. Metabolic encephalopathy present as well on admission. Her overall condition has been gradually declining even prior to admission and has since clinically much worsened. Family meeting on Saturday 08/13/16.  Healthcare power of attorney is at bedside. Patient is made DO NOT RESUSCITATE and they have decided to change her status to comfort care. -Patient is on comfort medications. Appears comfortable. Plan to transition to hospice home.  Final diagnosis:  #1 Sepsis- secondary to acute cystitis - urine cultures Growing Pseudomonas   #2 Hypernatremia  #3 AMS- metabolic encephalopathy,   #4 Hypokalemia- replaced  #5 Parkinsons disease-   #7 Hematemesis- resolved,     DISCHARGE CONDITIONS:   Critical  CONSULTS OBTAINED:   None  DRUG ALLERGIES:   Allergies  Allergen Reactions  . Nsaids  Other (See Comments)    Gastric upset.    DISCHARGE MEDICATIONS:   Allergies as of 08/15/2016      Reactions   Nsaids Other (See Comments)   Gastric upset.       Medication List    STOP taking these medications   ALPRAZolam 0.5 MG tablet Commonly known as:  XANAX   aspirin EC 325 MG tablet   bisacodyl 10 MG suppository Commonly known as:  DULCOLAX   CALTRATE 600+D 600-400 MG-UNIT tablet Generic drug:  Calcium Carbonate-Vitamin D   carbidopa-levodopa 50-200 MG tablet Commonly known as:  SINEMET CR   citalopram 20 MG tablet Commonly known as:  CELEXA   docusate sodium 100 MG capsule Commonly known as:  COLACE   feeding supplement (ENSURE ENLIVE) Liqd   ferrous sulfate 325 (65 FE) MG tablet   HYDROcodone-acetaminophen 5-325 MG tablet Commonly known as:  NORCO/VICODIN   mirtazapine 7.5 MG tablet Commonly known as:  REMERON   raloxifene 60 MG tablet Commonly known as:  EVISTA   Vitamin D-3 5000 units Tabs     TAKE these medications   acetaminophen 325 MG tablet Commonly known as:  TYLENOL Take 2 tablets (650 mg total) by mouth every 6 (six) hours as needed for mild pain (or Fever >/= 101).   LORazepam 2 MG/ML concentrated solution Commonly known as:  ATIVAN Take 0.5 mLs (1 mg total) by mouth every 4 (four) hours as needed for anxiety.   morphine CONCENTRATE 10 MG/0.5ML Soln concentrated solution Take 1 mL (20 mg total) by mouth every hour as needed for moderate pain, severe pain, anxiety or shortness of breath.  DISCHARGE INSTRUCTIONS:   Being discharged to hospice home  DIET:   As tolerated  ACTIVITY:   Activity as tolerated  OXYGEN:   Home Oxygen: Yes  Oxygen Delivery: 2L  DISCHARGE LOCATION:   Hospice home   If you experience worsening of your admission symptoms, develop shortness of breath, life threatening emergency, suicidal or homicidal thoughts you must seek medical attention immediately by calling 911 or calling your MD  immediately  if symptoms less severe.  You Must read complete instructions/literature along with all the possible adverse reactions/side effects for all the Medicines you take and that have been prescribed to you. Take any new Medicines after you have completely understood and accpet all the possible adverse reactions/side effects.   Please note  You were cared for by a hospitalist during your hospital stay. If you have any questions about your discharge medications or the care you received while you were in the hospital after you are discharged, you can call the unit and asked to speak with the hospitalist on call if the hospitalist that took care of you is not available. Once you are discharged, your primary care physician will handle any further medical issues. Please note that NO REFILLS for any discharge medications will be authorized once you are discharged, as it is imperative that you return to your primary care physician (or establish a relationship with a primary care physician if you do not have one) for your aftercare needs so that they can reassess your need for medications and monitor your lab values.    On the day of Discharge:  VITAL SIGNS:   Blood pressure (!) 112/45, pulse 100, temperature 99 F (37.2 C), resp. rate 20, height 5\' 7"  (1.702 m), weight 63 kg (138 lb 14.2 oz), SpO2 (!) 87 %.  PHYSICAL EXAMINATION:    GENERAL:  83 y.o.-year-old patient lying in the bed, ill appearing. Appears comfortable EYES: Pupils equal, round, reactive to light and accommodation. No scleral icterus. Extraocular muscles intact.  HEENT: Head atraumatic, normocephalic. Oropharynx and nasopharynx clear. Very dry mucus membranes  NECK:  Supple, no jugular venous distention. No thyroid enlargement, no tenderness.  LUNGS: Normal breath sounds bilaterally, no wheezing, rales,rhonchi or crepitation. No use of accessory muscles of respiration. Decreased bibasilar breath sounds CARDIOVASCULAR: S1, S2  normal. No murmurs, rubs, or gallops.  ABDOMEN: Soft, nontender, nondistended. Bowel sounds present. No organomegaly or mass.  EXTREMITIES: No pedal edema, cyanosis, or clubbing.  NEUROLOGIC: Obtunded and not following any commands. PSYCHIATRIC: The patient is obtunded SKIN: No obvious rash, lesion, or ulcer.   DATA REVIEW:   CBC  Recent Labs Lab 08/13/16 0606  WBC 5.8  HGB 9.3*  HCT 28.0*  PLT 179    Chemistries   Recent Labs Lab 08/11/16 1011  08/13/16 0606  NA 157*  < > 149*  K 3.9  < > 3.6  CL 117*  < > 114*  CO2 26  < > 31  GLUCOSE 97  < > 119*  BUN 52*  < > 24*  CREATININE 1.37*  < > 0.93  CALCIUM 8.3*  < > 8.1*  AST 17  --   --   ALT 9*  --   --   ALKPHOS 117  --   --   BILITOT 1.5*  --   --   < > = values in this interval not displayed.   Microbiology Results  Results for orders placed or performed during the hospital encounter of 08/11/16  Urine  culture     Status: Abnormal   Collection Time: 08/11/16  1:20 PM  Result Value Ref Range Status   Specimen Description URINE, RANDOM  Final   Special Requests NONE  Final   Culture >=100,000 COLONIES/mL PSEUDOMONAS AERUGINOSA (A)  Final   Report Status 08/14/2016 FINAL  Final   Organism ID, Bacteria PSEUDOMONAS AERUGINOSA (A)  Final      Susceptibility   Pseudomonas aeruginosa - MIC*    CEFTAZIDIME <=1 SENSITIVE Sensitive     CIPROFLOXACIN <=0.25 SENSITIVE Sensitive     GENTAMICIN <=1 SENSITIVE Sensitive     IMIPENEM >=16 RESISTANT Resistant     PIP/TAZO <=4 SENSITIVE Sensitive     CEFEPIME <=1 SENSITIVE Sensitive     * >=100,000 COLONIES/mL PSEUDOMONAS AERUGINOSA    RADIOLOGY:  No results found.   Management plans discussed with the patient, family and they are in agreement.  CODE STATUS:     Code Status Orders        Start     Ordered   08/13/16 1236  Do not attempt resuscitation (DNR)  Continuous    Question Answer Comment  In the event of cardiac or respiratory ARREST Do not call a  "code blue"   In the event of cardiac or respiratory ARREST Do not perform Intubation, CPR, defibrillation or ACLS   In the event of cardiac or respiratory ARREST Use medication by any route, position, wound care, and other measures to relive pain and suffering. May use oxygen, suction and manual treatment of airway obstruction as needed for comfort.      08/13/16 1235    Code Status History    Date Active Date Inactive Code Status Order ID Comments User Context   08/11/2016  7:02 PM 08/13/2016 12:35 PM Full Code [id number]  Katharina Caper, MD Inpatient   07/20/2016  2:48 AM 07/23/2016  7:08 PM Full Code [id number]  [name], MD Inpatient   07/19/2016 12:44 PM 07/20/2016  2:48 AM Full Code [id number]  Deeann Saint, MD ED    Advance Directive Documentation   Flowsheet Row Most Recent Value  Type of Advance Directive  Healthcare Power of Attorney, Living will  Pre-existing out of facility DNR order (yellow form or pink MOST form)  No data  "MOST" Form in Place?  No data      TOTAL TIME TAKING CARE OF THIS PATIENT: 37 minutes.    Enid Baas M.D on 08/15/2016 at 11:24 AM  Between 7am to 6pm - Pager - [phone]  After 6pm go to [email or web address] - Social research officer, government  [facility]  Office  [phone]  CC: Primary care physician; Rolm Gala, MD   Note: This dictation was prepared with Dragon dictation along with smaller phrase technology. Any transcriptional errors that result from this process are unintentional.

## 2016-08-15 NOTE — Care Management Important Message (Signed)
Important Message  Patient Details  Name: Wendy Meyer MRN: [id number] Date of Birth: May 16, 1930   Medicare Important Message Given:  Yes    Gwenette GreetBrenda S Mallori Araque, RN 08/15/2016, 10:23 AM

## 2016-08-15 NOTE — Progress Notes (Signed)
Follow up visit made to new hospice home referral received over the weekend. Patient remains unresponsive, requiring IV morphine for dyspnea. Patient's grandson Al, his wife Judeth CornfieldStephanie and patient's granddaughter Hospital doctorAmber at bedside. All remains in agreement for the patient to transfer to the hospice home today. She has had 2 doses of IV morphine this morning. Hospital care team all aware of and in agreement with discharge today to the hospice home via EMS. Signed portable DNR in place in discharge packet. Report called to the hospice home, EMS notified for transport. Discharge summary faxed to referral. Thank you. Dayna BarkerKaren Robertson RN, BSN, [facility], [facility] L[facility] [phone] c

## 2016-08-15 NOTE — Progress Notes (Signed)
Per Clydie BraunKaren [facility] lia[facility] patient can go to the hospice home today. Clinical Child psychotherapistocial Worker (CSW) prepared EMS packet including DNR. Please reconsult if future social work needs arise. CSW signing off.   Baker Hughes IncorporatedBailey Sharicka Pogorzelski, LCSW [phone]

## 2016-09-01 DEATH — deceased

## 2017-07-09 IMAGING — CR DG ABDOMEN ACUTE W/ 1V CHEST
1 series · 4 of 4 positions shown · non-contrast
Comparison: 07/23/2016

CLINICAL DATA: Recent vomiting

EXAM:
DG ABDOMEN ACUTE W/ 1V CHEST

[Series 1: x chest ap · 0.14mm/px · 4 of 4 slices shown]
[im 1/4]
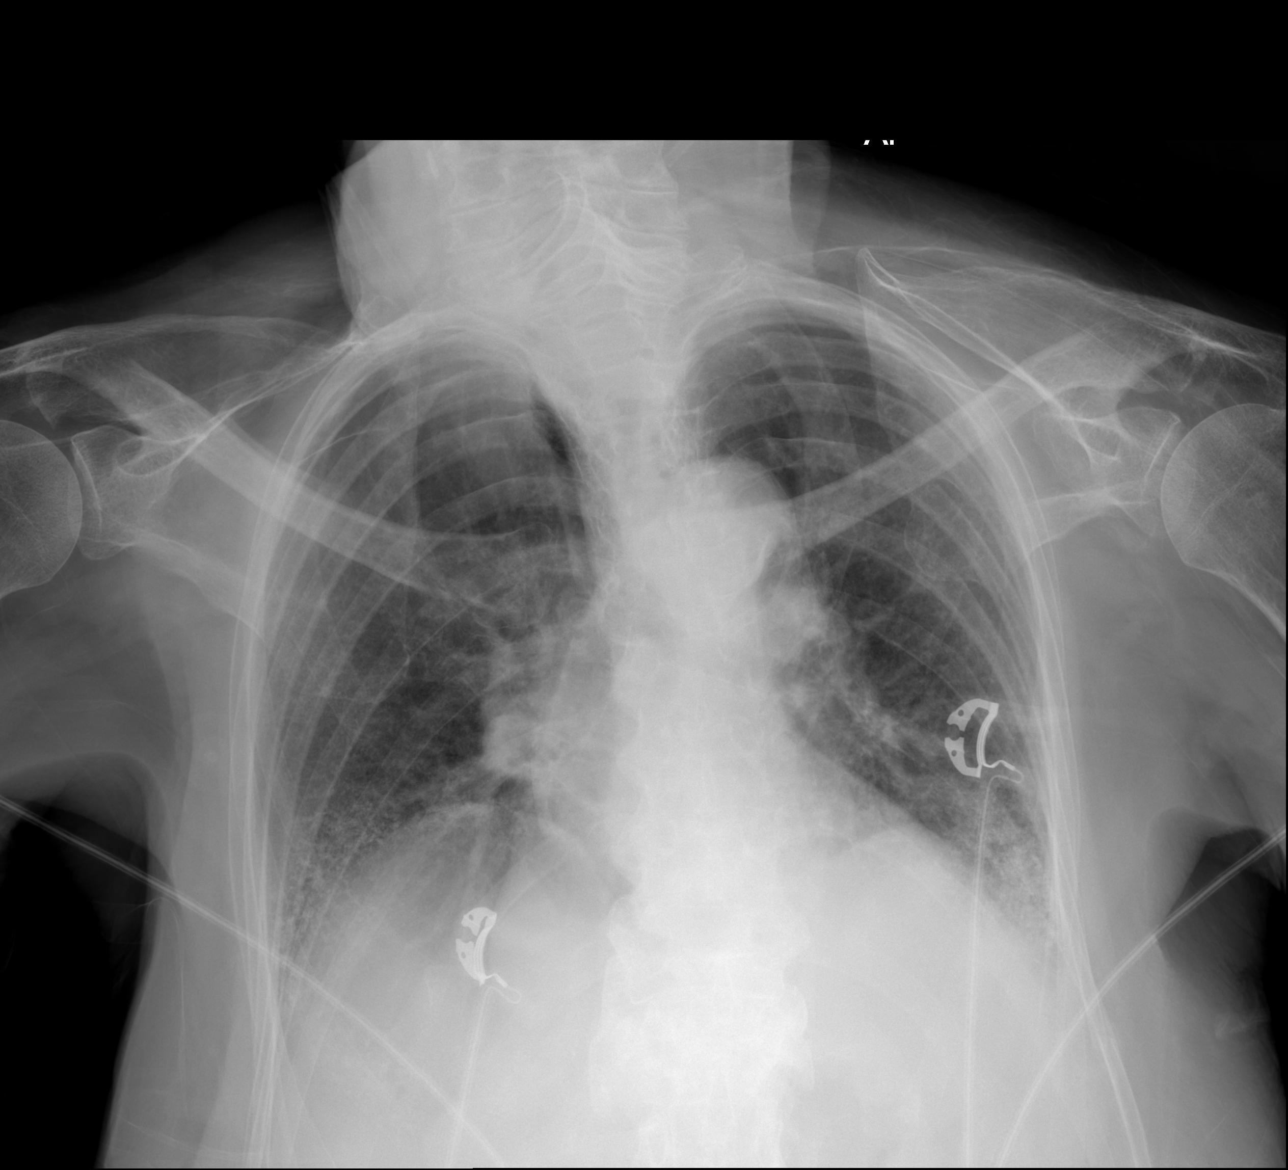
[im 2/4]
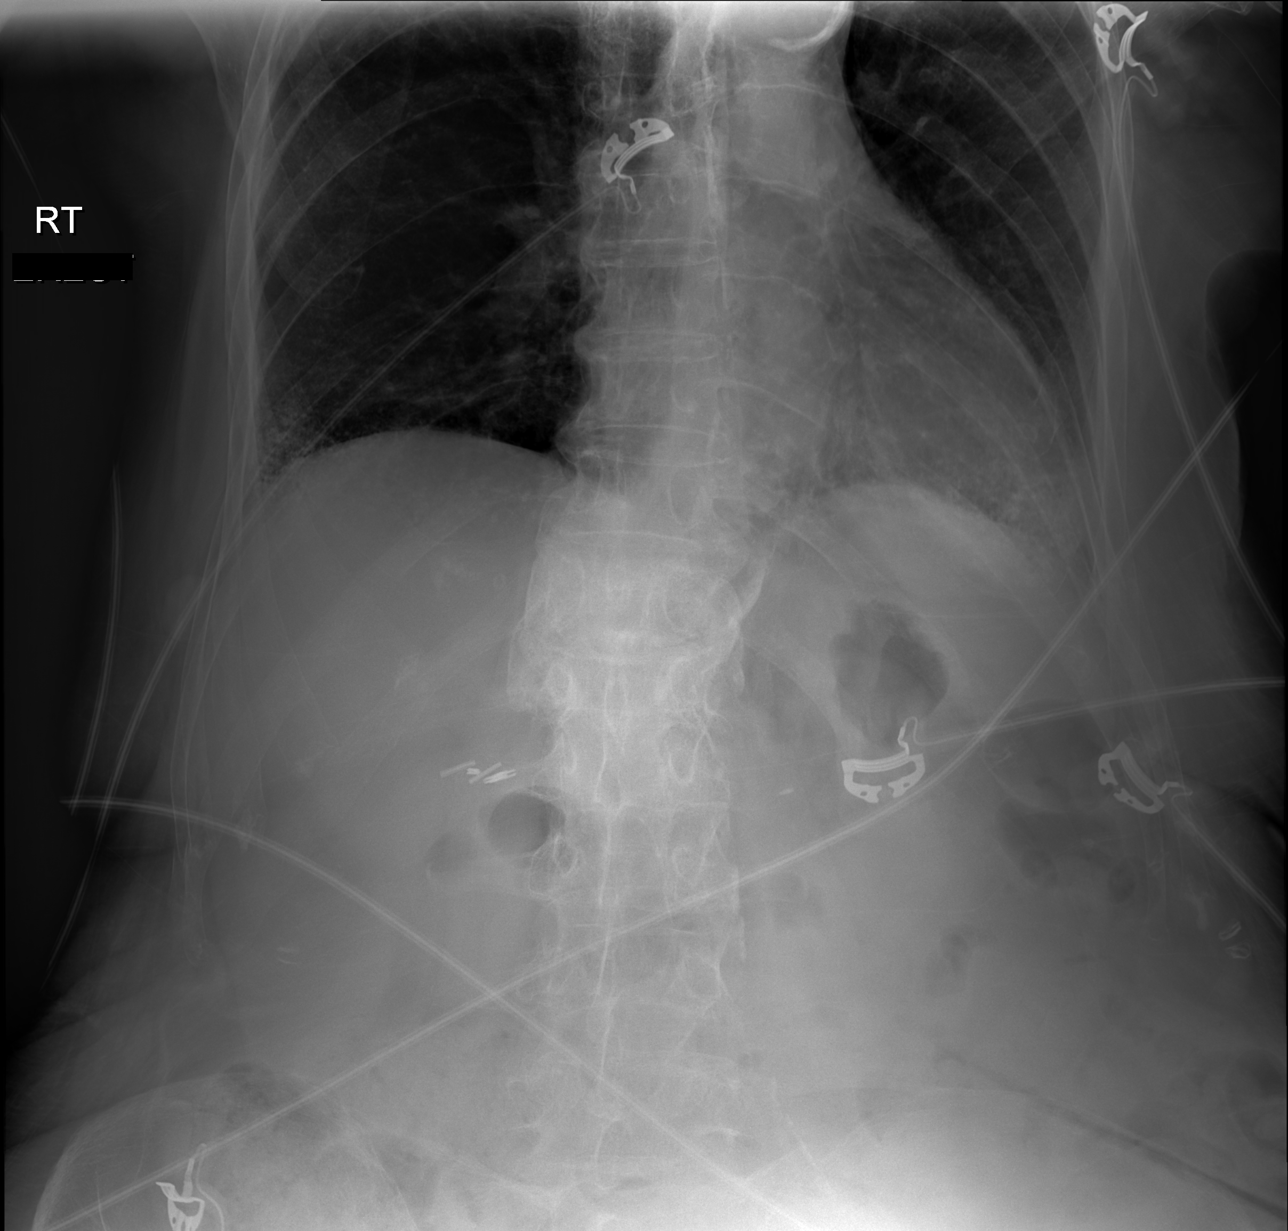
[im 3/4]
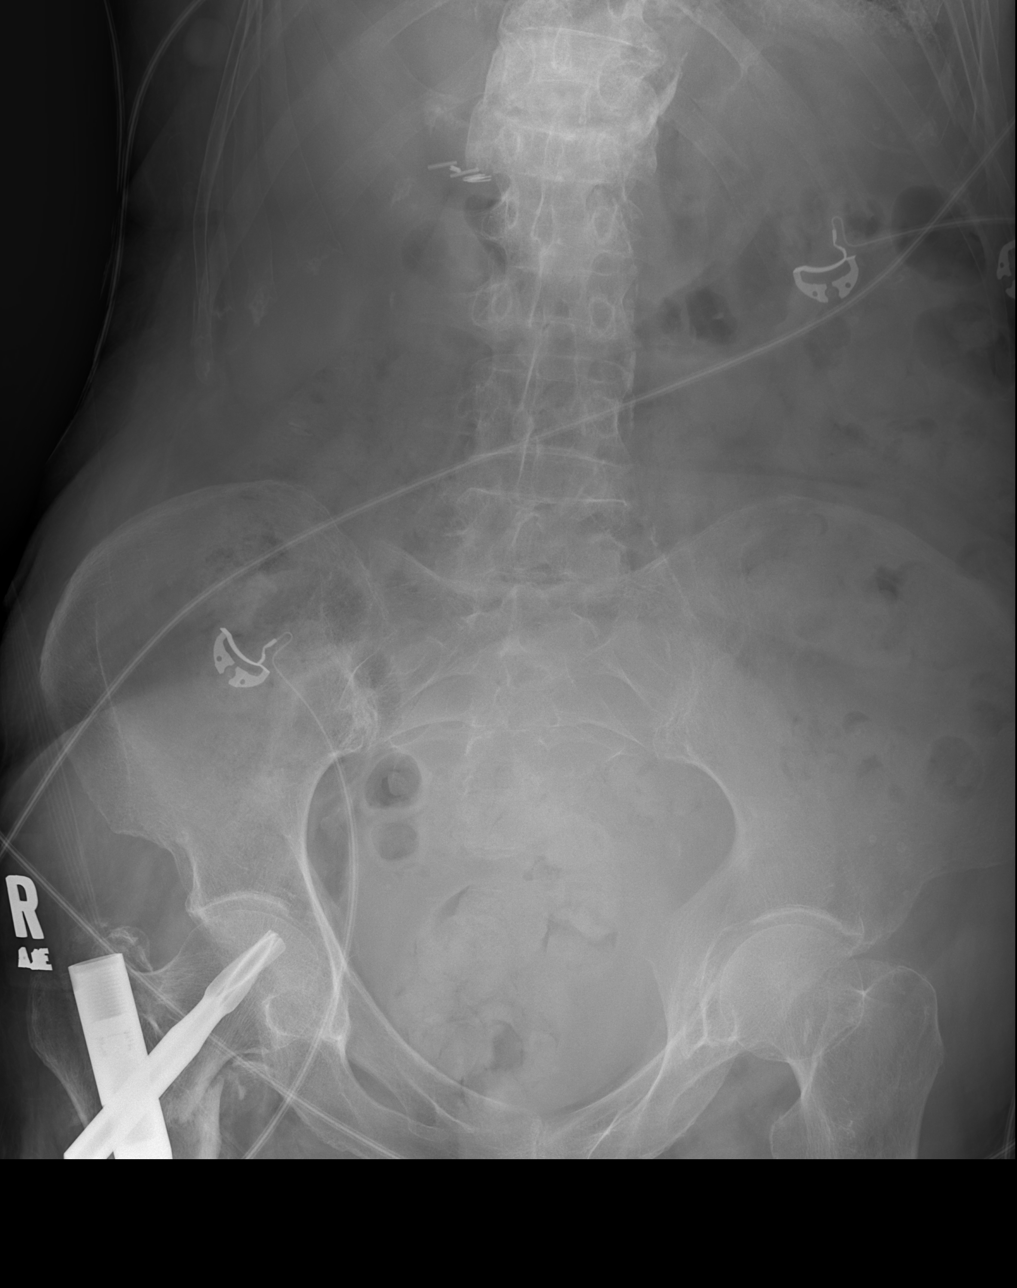
[im 4/4]
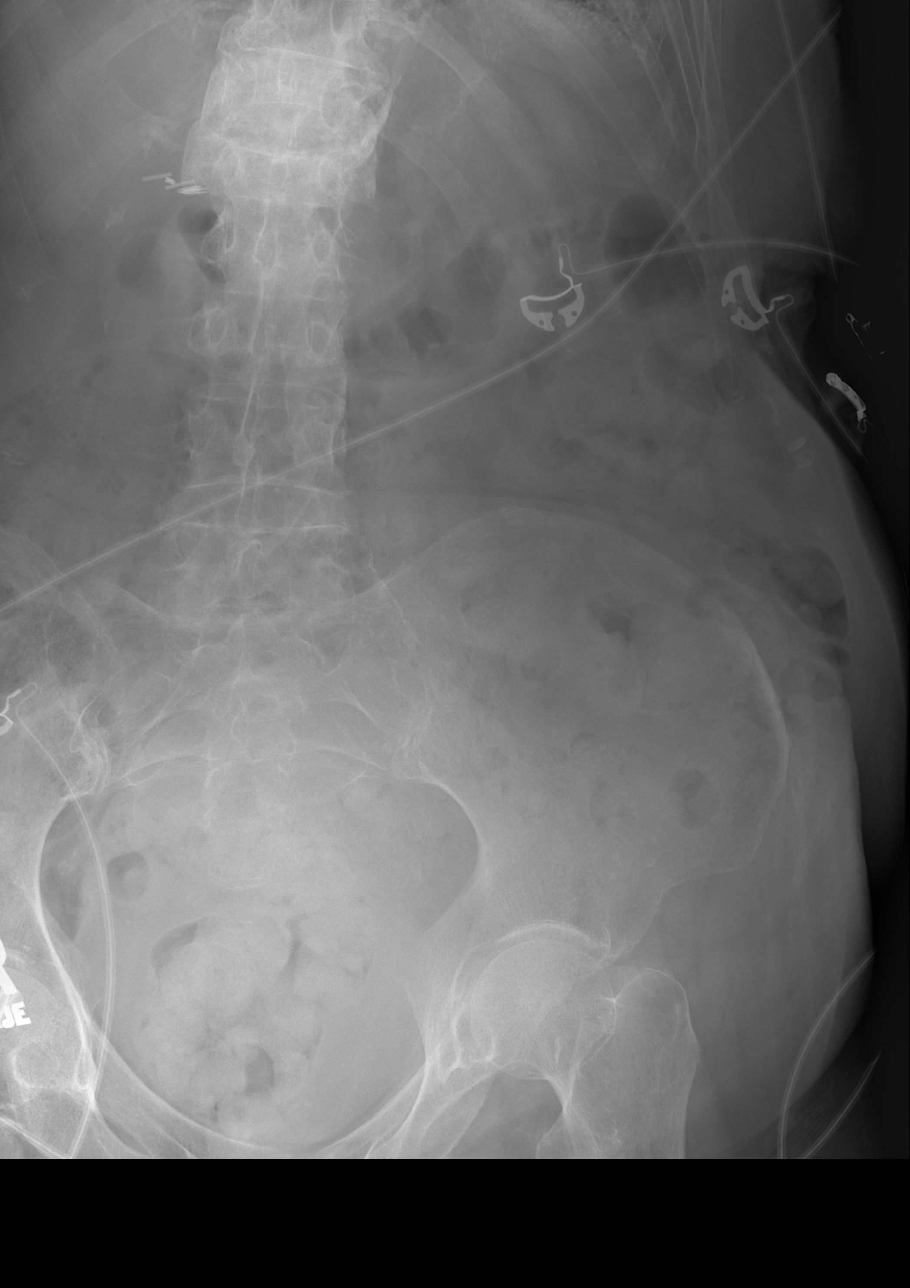

[4 of 4 positions shown; findings below may reference images not displayed]

FINDINGS: Cardiac shadow is mildly enlarged but stable. Aortic calcifications
are again seen. Mild basilar scarring is again seen and stable from
the prior exam.

Scattered large and small bowel gas is noted. Mild retained fecal
material is seen. Postoperative changes in the proximal right femur
are noted. No acute bony abnormality is seen.
IMPRESSION: Mild retained fecal material within the colon.

No new focal abnormality is seen.
